# Patient Record
Sex: Male | Born: 1955
Health system: Southern US, Community
[De-identification: ages and names within clinical notes are randomized; demographics above are authoritative.]

## PROBLEM LIST (undated history)

## (undated) DIAGNOSIS — K8020 Calculus of gallbladder without cholecystitis without obstruction: Secondary | ICD-10-CM

## (undated) DIAGNOSIS — I10 Essential (primary) hypertension: Secondary | ICD-10-CM

## (undated) DIAGNOSIS — E785 Hyperlipidemia, unspecified: Secondary | ICD-10-CM

## (undated) HISTORY — DX: Essential (primary) hypertension: I10

## (undated) HISTORY — DX: Calculus of gallbladder without cholecystitis without obstruction: K80.20

## (undated) HISTORY — DX: Hyperlipidemia, unspecified: E78.5

---

## 2002-10-02 ENCOUNTER — Emergency Department (HOSPITAL_COMMUNITY): Admission: EM | Admit: 2002-10-02 | Discharge: 2002-10-02 | Payer: Self-pay | Admitting: Emergency Medicine

## 2006-11-08 ENCOUNTER — Ambulatory Visit: Payer: Self-pay | Admitting: Unknown Physician Specialty

## 2006-12-08 ENCOUNTER — Ambulatory Visit: Payer: Self-pay | Admitting: Unknown Physician Specialty

## 2007-01-08 ENCOUNTER — Ambulatory Visit: Payer: Self-pay | Admitting: Unknown Physician Specialty

## 2010-07-08 HISTORY — PX: COLONOSCOPY: SHX174

## 2011-06-21 ENCOUNTER — Encounter (HOSPITAL_COMMUNITY): Payer: Self-pay | Admitting: Emergency Medicine

## 2011-06-21 ENCOUNTER — Emergency Department (HOSPITAL_COMMUNITY)
Admission: EM | Admit: 2011-06-21 | Discharge: 2011-06-21 | Disposition: A | Discharge status: Home / Self Care | Payer: 59 | Attending: Emergency Medicine | Admitting: Emergency Medicine

## 2011-06-21 ENCOUNTER — Emergency Department (HOSPITAL_COMMUNITY): Payer: 59

## 2011-06-21 DIAGNOSIS — R1013 Epigastric pain: Secondary | ICD-10-CM | POA: Insufficient documentation

## 2011-06-21 DIAGNOSIS — I498 Other specified cardiac arrhythmias: Secondary | ICD-10-CM | POA: Insufficient documentation

## 2011-06-21 DIAGNOSIS — K8050 Calculus of bile duct without cholangitis or cholecystitis without obstruction: Secondary | ICD-10-CM

## 2011-06-21 DIAGNOSIS — R1011 Right upper quadrant pain: Secondary | ICD-10-CM | POA: Insufficient documentation

## 2011-06-21 DIAGNOSIS — I4510 Unspecified right bundle-branch block: Secondary | ICD-10-CM | POA: Insufficient documentation

## 2011-06-21 DIAGNOSIS — K8020 Calculus of gallbladder without cholecystitis without obstruction: Secondary | ICD-10-CM | POA: Insufficient documentation

## 2011-06-21 LAB — DIFFERENTIAL
Basophils Absolute: 0 10*3/uL (ref 0.0–0.1)
Basophils Relative: 0 % (ref 0–1)
Eosinophils Absolute: 0 10*3/uL (ref 0.0–0.7)
Eosinophils Relative: 0 % (ref 0–5)
Lymphocytes Relative: 10 % — ABNORMAL LOW (ref 12–46)
Lymphs Abs: 1.2 10*3/uL (ref 0.7–4.0)
Monocytes Absolute: 0.7 10*3/uL (ref 0.1–1.0)
Monocytes Relative: 6 % (ref 3–12)
Neutro Abs: 10.3 10*3/uL — ABNORMAL HIGH (ref 1.7–7.7)
Neutrophils Relative %: 84 % — ABNORMAL HIGH (ref 43–77)

## 2011-06-21 LAB — URINALYSIS, ROUTINE W REFLEX MICROSCOPIC
Bilirubin Urine: NEGATIVE
Glucose, UA: NEGATIVE mg/dL
Hgb urine dipstick: NEGATIVE
Ketones, ur: NEGATIVE mg/dL
Leukocytes, UA: NEGATIVE
Nitrite: NEGATIVE
Protein, ur: NEGATIVE mg/dL
Specific Gravity, Urine: 1.022 (ref 1.005–1.030)
Urobilinogen, UA: 0.2 mg/dL (ref 0.0–1.0)
pH: 8.5 — ABNORMAL HIGH (ref 5.0–8.0)

## 2011-06-21 LAB — COMPREHENSIVE METABOLIC PANEL
ALT: 14 U/L (ref 0–53)
AST: 16 U/L (ref 0–37)
Albumin: 4.1 g/dL (ref 3.5–5.2)
Alkaline Phosphatase: 51 U/L (ref 39–117)
BUN: 12 mg/dL (ref 6–23)
CO2: 25 mEq/L (ref 19–32)
Calcium: 9.4 mg/dL (ref 8.4–10.5)
Chloride: 106 mEq/L (ref 96–112)
Creatinine, Ser: 1.27 mg/dL (ref 0.50–1.35)
GFR calc Af Amer: 72 mL/min — ABNORMAL LOW (ref >90)
GFR calc non Af Amer: 62 mL/min — ABNORMAL LOW (ref >90)
Glucose, Bld: 138 mg/dL — ABNORMAL HIGH (ref 70–99)
Potassium: 3.4 mEq/L — ABNORMAL LOW (ref 3.5–5.1)
Sodium: 140 mEq/L (ref 135–145)
Total Bilirubin: 0.8 mg/dL (ref 0.3–1.2)
Total Protein: 7.5 g/dL (ref 6.0–8.3)

## 2011-06-21 LAB — CBC
HCT: 41.8 % (ref 39.0–52.0)
Hemoglobin: 14.5 g/dL (ref 13.0–17.0)
MCH: 28.4 pg (ref 26.0–34.0)
MCHC: 34.7 g/dL (ref 30.0–36.0)
MCV: 82 fL (ref 78.0–100.0)
Platelets: 203 10*3/uL (ref 150–400)
RBC: 5.1 MIL/uL (ref 4.22–5.81)
RDW: 13.1 % (ref 11.5–15.5)
WBC: 12.2 10*3/uL — ABNORMAL HIGH (ref 4.0–10.5)

## 2011-06-21 LAB — LIPASE, BLOOD: Lipase: 21 U/L (ref 11–59)

## 2011-06-21 LAB — POCT I-STAT TROPONIN I: Troponin i, poc: 0 ng/mL (ref 0.00–0.08)

## 2011-06-21 MED ORDER — MORPHINE SULFATE 4 MG/ML IJ SOLN
INTRAMUSCULAR | Status: AC
  Administered 2011-06-21: 4 mg via INTRAVENOUS
  Filled 2011-06-21: qty 1

## 2011-06-21 MED ORDER — MORPHINE SULFATE 4 MG/ML IJ SOLN
4.0000 mg | Freq: Once | INTRAMUSCULAR | Status: AC
  Administered 2011-06-21: 4 mg via INTRAVENOUS
  Filled 2011-06-21: qty 1

## 2011-06-21 MED ORDER — SODIUM CHLORIDE 0.9 % IV SOLN
1000.0000 mL | INTRAVENOUS | Status: DC
  Administered 2011-06-21 (×4): 1000 mL via INTRAVENOUS

## 2011-06-21 MED ORDER — ONDANSETRON HCL 4 MG/2ML IJ SOLN
INTRAMUSCULAR | Status: AC
  Administered 2011-06-21: 4 mg via INTRAVENOUS
  Filled 2011-06-21: qty 2

## 2011-06-21 MED ORDER — GI COCKTAIL ~~LOC~~
30.0000 mL | Freq: Once | ORAL | Status: AC
  Administered 2011-06-21: 30 mL via ORAL
  Filled 2011-06-21: qty 30

## 2011-06-21 MED ORDER — MORPHINE SULFATE 4 MG/ML IJ SOLN
4.0000 mg | Freq: Once | INTRAMUSCULAR | Status: AC
  Administered 2011-06-21: 4 mg via INTRAVENOUS

## 2011-06-21 MED ORDER — ONDANSETRON HCL 4 MG/2ML IJ SOLN
4.0000 mg | Freq: Once | INTRAMUSCULAR | Status: AC
  Administered 2011-06-21: 4 mg via INTRAVENOUS

## 2011-06-21 MED ORDER — OXYCODONE-ACETAMINOPHEN 5-325 MG PO TABS: 2.0000 | ORAL_TABLET | Freq: Four times a day (QID) | ORAL | Status: AC | PRN

## 2011-06-21 MED ORDER — ONDANSETRON 8 MG PO TBDP: 8.0000 mg | ORAL_TABLET | Freq: Three times a day (TID) | ORAL | Status: AC | PRN

## 2011-06-21 MED ORDER — ONDANSETRON HCL 4 MG/2ML IJ SOLN
4.0000 mg | Freq: Once | INTRAMUSCULAR | Status: AC
  Administered 2011-06-21: 4 mg via INTRAVENOUS
  Filled 2011-06-21: qty 2

## 2011-06-21 NOTE — ED Notes (Signed)
Per EMS,pt. Is from home with complaint of acute severe abdominal pain ,denies N/V, no chest pain. Pt.is alert and oriented.

## 2011-06-21 NOTE — Discharge Instructions (Signed)
Biliary Colic  Biliary colic is a steady or irregular pain in the upper abdomen. It is usually under the right side of the rib cage. It happens when gallstones interfere with the normal flow of bile from the gallbladder. Bile is a liquid that helps to digest fats. Bile is made in the liver and stored in the gallbladder. When you eat a meal, bile passes from the gallbladder through the cystic duct and the common bile duct into the small intestine. There, it mixes with partially digested food. If a gallstone blocks either of these ducts, the normal flow of bile is blocked. The muscle cells in the bile duct contract forcefully to try to move the stone. This causes the pain of biliary colic.  SYMPTOMS   A person with biliary colic usually complains of pain in the upper abdomen. This pain can be:   In the center of the upper abdomen just below the breastbone.   In the upper-right part of the abdomen, near the gallbladder and liver.   Spread back toward the right shoulder blade.   Nausea and vomiting.   The pain usually occurs after eating.   Biliary colic is usually triggered by the digestive system's demand for bile. The demand for bile is high after fatty meals. Symptoms can also occur when a person who has been fasting suddenly eats a very large meal. Most episodes of biliary colic pass after 1 to 5 hours. After the most intense pain passes, your abdomen may continue to ache mildly for about 24 hours.  DIAGNOSIS  After you describe your symptoms, your caregiver will perform a physical exam. He or she will pay attention to the upper right portion of your belly (abdomen). This is the area of your liver and gallbladder. An ultrasound will help your caregiver look for gallstones. Specialized scans of the gallbladder may also be done. Blood tests may be done, especially if you have fever or if your pain persists. PREVENTION  Biliary colic can be prevented by controlling the risk factors for gallstones.  Some of these risk factors, such as heredity, increasing age, and pregnancy are a normal part of life. Obesity and a high-fat diet are risk factors you can change through a healthy lifestyle. Women going through menopause who take hormone replacement therapy (estrogen) are also more likely to develop biliary colic. TREATMENT   Pain medication may be prescribed.   You may be encouraged to eat a fat-free diet.   If the first episode of biliary colic is severe, or episodes of colic keep retuning, surgery to remove the gallbladder (cholecystectomy) is usually recommended. This procedure can be done through small incisions using an instrument called a laparoscope. The procedure often requires a brief stay in the hospital. Some people can leave the hospital the same day. It is the most widely used treatment in people troubled by painful gallstones. It is effective and safe, with no complications in more than 90% of cases.   If surgery cannot be done, medication that dissolves gallstones may be used. This medication is expensive and can take months or years to work. Only small stones will dissolve.   Rarely, medication to dissolve gallstones is combined with a procedure called shock-wave lithotripsy. This procedure uses carefully aimed shock waves to break up gallstones. In many people treated with this procedure, gallstones form again within a few years.  PROGNOSIS  If gallstones block your cystic duct or common bile duct, you are at risk for repeated episodes of   duct, you are at risk for repeated episodes of biliary colic. There is also a 25% chance that you will develop a gallbladder infection(acute cholecystitis), or some other complication of gallstones within 10 to 20 years. If you have surgery, schedule it at a time that is convenient for you and at a time when you are not sick.  HOME CARE INSTRUCTIONS    Drink plenty of clear fluids.   Avoid fatty, greasy or fried foods, or any foods that make your pain worse.   Take medications as directed.  SEEK MEDICAL  CARE IF:    You develop a fever over 100.5 F (38.1 C).   Your pain gets worse over time.   You develop nausea that prevents you from eating and drinking.   You develop vomiting.  SEEK IMMEDIATE MEDICAL CARE IF:    You have continuous or severe belly (abdominal) pain which is not relieved with medications.   You develop nausea and vomiting which is not relieved with medications.   You have symptoms of biliary colic and you suddenly develop a fever and shaking chills. This may signal cholecystitis. Call your caregiver immediately.   You develop a yellow color to your skin or the white part of your eyes (jaundice).  Document Released: 07/27/2005 Document Revised: 02/12/2011 Document Reviewed: 10/06/2007  [employer] Patient Information 2012 [employer].

## 2011-06-21 NOTE — ED Provider Notes (Signed)
History     CSN: [id number]  Arrival date & time 06/21/11  0455   First MD Initiated Contact with Patient 06/21/11 [phone]      Chief Complaint  Patient presents with  . Abdominal Pain    (Consider location/radiation/quality/duration/timing/severity/associated sxs/prior treatment) HPI Patient is a 56 year old male who presents for evaluation of epigastric abdominal pain that began acutely at around midnight. The patient had had pizza prior to bedtime. He denies a burning sensation and reports that his pain is "just pain". He rated this initially is a 10 out of 10 but says now it as a 6/10 after receiving morphine and Zofran and was given by the overnight physician. Patient is hemodynamically stable. He denies any chest pain or shortness of breath and has no history of diabetes or coronary artery disease. His wife does report that he got very sweaty with the most intense portion of episode. He denies any diarrhea or constipation. The patient has had no blood in his stools or dark her stools. Patient has no known sick contacts. Nothing has made his symptoms better or worse. His pain is without radiation. History reviewed. No pertinent past medical history.  History reviewed. No pertinent past surgical history.  History reviewed. No pertinent family history.  History  Substance Use Topics  . Smoking status: Former Smoker -- 5 years    Types: Cigarettes  . Smokeless tobacco: Not on file  . Alcohol Use:      occasional      Review of Systems  Constitutional: Negative.   HENT: Negative.   Eyes: Negative.   Respiratory: Negative.   Cardiovascular: Negative.   Gastrointestinal: Positive for nausea and abdominal pain.  Genitourinary: Negative.   Musculoskeletal: Negative.   Neurological: Negative.   Hematological: Negative.   Psychiatric/Behavioral: Negative.   All other systems reviewed and are negative.    Allergies  Review of patient's allergies indicates no known  allergies.  Home Medications  No current outpatient prescriptions on file.  BP 119/50  Pulse 48  Temp(Src) 97.9 F (36.6 C) (Oral)  Resp 16  Ht 5\' 10"  (1.778 m)  Wt 300 lb (136.079 kg)  BMI 43.05 kg/m2  SpO2 100%  Physical Exam  Nursing note and vitals reviewed. GEN: Well-developed, well-nourished male in no distress HEENT: Atraumatic, normocephalic. Oropharynx clear without erythema EYES: PERRLA BL, no scleral icterus. NECK: Trachea midline, no meningismus CV: regular rate and rhythm. No murmurs, rubs, or gallops PULM: No respiratory distress.  No crackles, wheezes, or rales. GI: soft, mild tenderness to palpation in the epigastrium and right upper quadrant.. No guarding, rebound. + bowel sounds  GU: deferred Neuro: cranial nerves 2-12 intact, no abnormalities of strength or sensation, A and O x 3 MSK: Patient moves all 4 extremities symmetrically, no deformity, edema, or injury noted Skin: No rashes petechiae, purpura, or jaundice Psych: no abnormality of mood   ED Course  Procedures (including critical care time)   Date: 06/21/2011  Rate: 57  Rhythm: sinus arrhythmia  QRS Axis: normal  Intervals: normal  ST/T Wave abnormalities: nonspecific T wave changes  Conduction Disutrbances:incomplete RBBB  Narrative Interpretation:   Old EKG Reviewed: none available   Labs Reviewed  CBC - Abnormal; Notable for the following:    WBC 12.2 (*)    All other components within normal limits  DIFFERENTIAL - Abnormal; Notable for the following:    Neutrophils Relative 84 (*)    Neutro Abs 10.3 (*)    Lymphocytes Relative 10 (*)  All other components within normal limits  URINALYSIS, ROUTINE W REFLEX MICROSCOPIC - Abnormal; Notable for the following:    pH 8.5 (*)    All other components within normal limits  SPECIMEN HOLD  COMPREHENSIVE METABOLIC PANEL  LIPASE, BLOOD   No results found.   1. Biliary colic       MDM  Patient had an episode of what appeared to  be GERD vs. Biliary colic that occurred following eating pizza and going to bed.  Labs were ordered ad patient was treated symptomatically.  GI cocktail did not improve his symptoms and he continued to have some pain.  [place] was performed with gall stones but no acute cholecystitis.  Patient was referred to [facility] for evaluation of elective cholecystectomy and discharged with prescriptions for pain and nausea medications.  He was told that his symptoms worsen, he develops vomiting, or if he develops fevers to return for further evaluation.  Patient and family were comfortable with plan and the patient was discharged in good condition.        Cyndra Numbers, MD 06/22/11 [phone]

## 2011-07-14 ENCOUNTER — Ambulatory Visit (INDEPENDENT_AMBULATORY_CARE_PROVIDER_SITE_OTHER): Payer: BC Managed Care – HMO | Admitting: General Surgery

## 2011-07-14 ENCOUNTER — Encounter (INDEPENDENT_AMBULATORY_CARE_PROVIDER_SITE_OTHER): Payer: Self-pay | Admitting: General Surgery

## 2011-07-14 DIAGNOSIS — K8020 Calculus of gallbladder without cholecystitis without obstruction: Secondary | ICD-10-CM | POA: Insufficient documentation

## 2011-07-14 NOTE — Progress Notes (Signed)
Subjective:     Patient ID: Barry Clark, male   DOB: [birth year], 56 y.o.   MRN: [id number]  HPI We're asked to see the patient in consultation by Dr. Alto Denver to evaluate him for gallstones. The patient is a 56 year old black male who first had an episode of abdominal pain about a year ago. He then had a severe attack of abdominal pain at Christmas time. This pain lasted about 12 hours. The pain was associated with significant nausea. The pain resolved and then about a month ago he had another severe pain that took him to the emergency room. He is feeling better now. An ultrasound was done at the time but did show a large stone in the gallbladder but no gallbladder wall thickening or ductal dilatation. His liver functions are normal. His lipase was normal.  Review of Systems  Constitutional: Negative.   HENT: Negative.   Eyes: Negative.   Respiratory: Negative.   Cardiovascular: Negative.   Gastrointestinal: Negative.   Genitourinary: Negative.   Musculoskeletal: Negative.   Skin: Negative.   Neurological: Negative.   Hematological: Negative.   Psychiatric/Behavioral: Negative.        Objective:   Physical Exam  Constitutional: He is oriented to person, place, and time. He appears well-developed and well-nourished.  HENT:  Head: Normocephalic and atraumatic.  Eyes: Conjunctivae and EOM are normal. Pupils are equal, round, and reactive to light.  Neck: Normal range of motion. Neck supple.  Cardiovascular: Normal rate, regular rhythm and normal heart sounds.   Pulmonary/Chest: Effort normal and breath sounds normal.  Abdominal: Soft. Bowel sounds are normal. He exhibits no mass. There is no tenderness.  Musculoskeletal: Normal range of motion.  Neurological: He is alert and oriented to person, place, and time.  Skin: Skin is warm and dry.  Psychiatric: He has a normal mood and affect. His behavior is normal.       Assessment:     The patient has what sounds like symptomatic  gallstones. Because of the risk of worsening pain and possible pancreatitis I think he would benefit from having his gallbladder removed. He would also like to have this done. I have discussed with him in detail the risks and benefits of the operation remove the gallbladder as well as some of the technical aspects and he understands and wishes to proceed.    Plan:     Plan for laparoscopic cholecystectomy with intraoperative cholangiogram

## 2011-07-24 ENCOUNTER — Encounter (HOSPITAL_COMMUNITY)
Admission: RE | Admit: 2011-07-24 | Discharge: 2011-07-24 | Disposition: A | Discharge status: Home / Self Care | Payer: BC Managed Care – PPO | Source: Ambulatory Visit | Attending: General Surgery | Admitting: General Surgery

## 2011-07-24 ENCOUNTER — Encounter (HOSPITAL_COMMUNITY): Payer: Self-pay

## 2011-07-24 ENCOUNTER — Telehealth (INDEPENDENT_AMBULATORY_CARE_PROVIDER_SITE_OTHER): Payer: Self-pay | Admitting: General Surgery

## 2011-07-24 LAB — CBC
HCT: 43.1 % (ref 39.0–52.0)
Hemoglobin: 15.2 g/dL (ref 13.0–17.0)
MCH: 28.4 pg (ref 26.0–34.0)
MCHC: 35.3 g/dL (ref 30.0–36.0)
MCV: 80.6 fL (ref 78.0–100.0)
Platelets: 178 10*3/uL (ref 150–400)
RBC: 5.35 MIL/uL (ref 4.22–5.81)
RDW: 13.4 % (ref 11.5–15.5)
WBC: 9 10*3/uL (ref 4.0–10.5)

## 2011-07-24 LAB — SURGICAL PCR SCREEN
MRSA, PCR: NEGATIVE
Staphylococcus aureus: NEGATIVE

## 2011-07-24 MED ORDER — CHLORHEXIDINE GLUCONATE 4 % EX LIQD: 1.0000 "application " | Freq: Once | CUTANEOUS | Status: DC

## 2011-07-24 NOTE — Pre-Procedure Instructions (Addendum)
20 Siddarth Hsiung  07/24/2011   Your procedure is scheduled on 08/06/11  Report to [facility] at 630 AM.  Call this number if you have problems the morning of surgery: [phone]   Remember:   Do not eat food:After Midnight.  May have clear liquids: up to 4 Hours before arrival. 230 am  Clear liquids include soda, tea, black coffee, apple or grape juice, broth.  Take these medicines the morning of surgery with A SIP OF WATER: pain med ,zofran STOP any aspirin, nsaids, herbal meds, blood thinners   Do not wear jewelry,   Do not wear lotions, powders, . You may wear deodorant.  Do not shave 48 hours prior to surgery. Men may shave face and neck.  Do not bring valuables to the hospital.  Contacts, dentures or bridgework may not be worn into surgery.  Leave suitcase in the car. After surgery it may be brought to your room.  For patients admitted to the hospital, checkout time is 11:00 AM the day of discharge.   Patients discharged the day of surgery will not be allowed to drive home.  Name and phone number of your driver: Tobi Bastos [phone]  Special Instructions: CHG Shower Use Special Wash: 1/2 bottle night before surgery and 1/2 bottle morning of surgery.   Please read over the following fact sheets that you were given: Pain Booklet, Coughing and Deep Breathing, MRSA Information and Surgical Site Infection Prevention

## 2011-07-24 NOTE — Telephone Encounter (Signed)
You saw this pt back on 07-14-11 in the office for gallbladder

## 2011-07-24 NOTE — Telephone Encounter (Signed)
Message copied by Wilder Glade on Fri Jul 24, 2011 10:08 AM ------      Message from: Caleen Essex III      Created: Fri Jul 24, 2011 10:00 AM       i don't know this pt

## 2011-07-27 ENCOUNTER — Encounter (HOSPITAL_COMMUNITY): Payer: Self-pay | Admitting: Pharmacy Technician

## 2011-08-05 MED ORDER — CEFAZOLIN SODIUM-DEXTROSE 2-3 GM-% IV SOLR
2.0000 g | INTRAVENOUS | Status: DC
  Filled 2011-08-05: qty 50

## 2011-08-06 ENCOUNTER — Encounter (HOSPITAL_COMMUNITY): Payer: Self-pay | Admitting: Anesthesiology

## 2011-08-06 ENCOUNTER — Ambulatory Visit (HOSPITAL_COMMUNITY): Payer: BC Managed Care – PPO

## 2011-08-06 ENCOUNTER — Ambulatory Visit (HOSPITAL_COMMUNITY): Payer: BC Managed Care – PPO | Admitting: Anesthesiology

## 2011-08-06 ENCOUNTER — Encounter (HOSPITAL_COMMUNITY)
Admission: RE | Disposition: A | Discharge status: Home / Self Care | Payer: Self-pay | Source: Ambulatory Visit | Attending: General Surgery

## 2011-08-06 ENCOUNTER — Ambulatory Visit (HOSPITAL_COMMUNITY)
Admission: RE | Admit: 2011-08-06 | Discharge: 2011-08-06 | Disposition: A | Discharge status: Home / Self Care | Payer: BC Managed Care – PPO | Source: Ambulatory Visit | Attending: General Surgery | Admitting: General Surgery

## 2011-08-06 ENCOUNTER — Encounter (HOSPITAL_COMMUNITY): Payer: Self-pay | Admitting: *Deleted

## 2011-08-06 DIAGNOSIS — K8010 Calculus of gallbladder with chronic cholecystitis without obstruction: Secondary | ICD-10-CM

## 2011-08-06 DIAGNOSIS — Z01812 Encounter for preprocedural laboratory examination: Secondary | ICD-10-CM | POA: Insufficient documentation

## 2011-08-06 HISTORY — PX: CHOLECYSTECTOMY: SHX55

## 2011-08-06 SURGERY — LAPAROSCOPIC CHOLECYSTECTOMY WITH INTRAOPERATIVE CHOLANGIOGRAM
Anesthesia: General | Site: Abdomen | Wound class: Clean Contaminated

## 2011-08-06 MED ORDER — HYDROMORPHONE HCL PF 1 MG/ML IJ SOLN: 0.2500 mg | INTRAMUSCULAR | Status: DC | PRN

## 2011-08-06 MED ORDER — OXYCODONE HCL 5 MG PO TABS
5.0000 mg | ORAL_TABLET | ORAL | Status: DC | PRN
  Administered 2011-08-06 (×2): 5 mg via ORAL

## 2011-08-06 MED ORDER — SODIUM CHLORIDE 0.9 % IJ SOLN: 3.0000 mL | Freq: Two times a day (BID) | INTRAMUSCULAR | Status: DC

## 2011-08-06 MED ORDER — MEPERIDINE HCL 25 MG/ML IJ SOLN: 6.2500 mg | INTRAMUSCULAR | Status: DC | PRN

## 2011-08-06 MED ORDER — LIDOCAINE HCL 4 % MT SOLN
OROMUCOSAL | Status: DC | PRN
  Administered 2011-08-06: 4 mL via TOPICAL

## 2011-08-06 MED ORDER — MIDAZOLAM HCL 5 MG/5ML IJ SOLN
INTRAMUSCULAR | Status: DC | PRN
  Administered 2011-08-06: 2 mg via INTRAVENOUS

## 2011-08-06 MED ORDER — NEOSTIGMINE METHYLSULFATE 1 MG/ML IJ SOLN
INTRAMUSCULAR | Status: DC | PRN
  Administered 2011-08-06: 5 mg via INTRAVENOUS

## 2011-08-06 MED ORDER — SODIUM CHLORIDE 0.9 % IV SOLN: 250.0000 mL | INTRAVENOUS | Status: DC | PRN

## 2011-08-06 MED ORDER — LIDOCAINE HCL (CARDIAC) 20 MG/ML IV SOLN
INTRAVENOUS | Status: DC | PRN
  Administered 2011-08-06: 100 mg via INTRAVENOUS

## 2011-08-06 MED ORDER — PROMETHAZINE HCL 25 MG/ML IJ SOLN: 6.2500 mg | INTRAMUSCULAR | Status: DC | PRN

## 2011-08-06 MED ORDER — SODIUM CHLORIDE 0.9 % IJ SOLN: 3.0000 mL | INTRAMUSCULAR | Status: DC | PRN

## 2011-08-06 MED ORDER — FENTANYL CITRATE 0.05 MG/ML IJ SOLN
INTRAMUSCULAR | Status: DC | PRN
  Administered 2011-08-06: 100 ug via INTRAVENOUS
  Administered 2011-08-06 (×2): 50 ug via INTRAVENOUS

## 2011-08-06 MED ORDER — GLYCOPYRROLATE 0.2 MG/ML IJ SOLN
INTRAMUSCULAR | Status: DC | PRN
  Administered 2011-08-06: .8 mg via INTRAVENOUS

## 2011-08-06 MED ORDER — HYDROCODONE-ACETAMINOPHEN 5-325 MG PO TABS: 1.0000 | ORAL_TABLET | Freq: Four times a day (QID) | ORAL | Status: AC | PRN

## 2011-08-06 MED ORDER — SODIUM CHLORIDE 0.9 % IV SOLN
INTRAVENOUS | Status: DC | PRN
  Administered 2011-08-06: 10:00:00

## 2011-08-06 MED ORDER — LACTATED RINGERS IV SOLN: INTRAVENOUS | Status: DC

## 2011-08-06 MED ORDER — ACETAMINOPHEN 650 MG RE SUPP
650.0000 mg | RECTAL | Status: DC | PRN
  Filled 2011-08-06: qty 1

## 2011-08-06 MED ORDER — LACTATED RINGERS IV SOLN
INTRAVENOUS | Status: DC | PRN
  Administered 2011-08-06: 08:00:00 via INTRAVENOUS

## 2011-08-06 MED ORDER — ONDANSETRON HCL 4 MG/2ML IJ SOLN
4.0000 mg | Freq: Four times a day (QID) | INTRAMUSCULAR | Status: DC | PRN
  Filled 2011-08-06: qty 2

## 2011-08-06 MED ORDER — ROCURONIUM BROMIDE 100 MG/10ML IV SOLN
INTRAVENOUS | Status: DC | PRN
  Administered 2011-08-06: 10 mg via INTRAVENOUS
  Administered 2011-08-06: 50 mg via INTRAVENOUS
  Administered 2011-08-06: 10 mg via INTRAVENOUS

## 2011-08-06 MED ORDER — PROPOFOL 10 MG/ML IV EMUL
INTRAVENOUS | Status: DC | PRN
  Administered 2011-08-06: 200 mg via INTRAVENOUS

## 2011-08-06 MED ORDER — SODIUM CHLORIDE 0.9 % IR SOLN
Status: DC | PRN
  Administered 2011-08-06: 1000 mL

## 2011-08-06 MED ORDER — 0.9 % SODIUM CHLORIDE (POUR BTL) OPTIME
TOPICAL | Status: DC | PRN
  Administered 2011-08-06: 1000 mL

## 2011-08-06 MED ORDER — BUPIVACAINE-EPINEPHRINE 0.25% -1:200000 IJ SOLN
INTRAMUSCULAR | Status: DC | PRN
  Administered 2011-08-06: 25 mL

## 2011-08-06 MED ORDER — CEFAZOLIN SODIUM 1-5 GM-% IV SOLN
INTRAVENOUS | Status: DC | PRN
  Administered 2011-08-06: 2 g via INTRAVENOUS

## 2011-08-06 MED ORDER — ACETAMINOPHEN 325 MG PO TABS
650.0000 mg | ORAL_TABLET | ORAL | Status: DC | PRN
  Filled 2011-08-06: qty 2

## 2011-08-06 MED ORDER — OXYCODONE HCL 5 MG PO TABS
ORAL_TABLET | ORAL | Status: AC
  Filled 2011-08-06: qty 1

## 2011-08-06 MED ORDER — ONDANSETRON HCL 4 MG/2ML IJ SOLN
INTRAMUSCULAR | Status: DC | PRN
  Administered 2011-08-06: 4 mg via INTRAVENOUS

## 2011-08-06 SURGICAL SUPPLY — 43 items
ADH SKN CLS APL DERMABOND .7 (GAUZE/BANDAGES/DRESSINGS) ×1
APPLIER CLIP ROT 10 11.4 M/L (STAPLE) ×2
APR CLP MED LRG 11.4X10 (STAPLE) ×1
BAG SPEC RTRVL LRG 6X4 10 (ENDOMECHANICALS) ×1
BLADE SURG ROTATE 9660 (MISCELLANEOUS) ×1 IMPLANT
CANISTER SUCTION 2500CC (MISCELLANEOUS) ×2 IMPLANT
CATH REDDICK CHOLANGI 4FR 50CM (CATHETERS) ×2 IMPLANT
CHLORAPREP W/TINT 26ML (MISCELLANEOUS) ×2 IMPLANT
CLIP APPLIE ROT 10 11.4 M/L (STAPLE) ×1 IMPLANT
CLOTH BEACON ORANGE TIMEOUT ST (SAFETY) ×2 IMPLANT
COVER MAYO STAND STRL (DRAPES) ×2 IMPLANT
COVER SURGICAL LIGHT HANDLE (MISCELLANEOUS) ×2 IMPLANT
DECANTER SPIKE VIAL GLASS SM (MISCELLANEOUS) ×2 IMPLANT
DERMABOND ADVANCED (GAUZE/BANDAGES/DRESSINGS) ×1
DERMABOND ADVANCED .7 DNX12 (GAUZE/BANDAGES/DRESSINGS) ×1 IMPLANT
DRAPE C-ARM 42X72 X-RAY (DRAPES) ×2 IMPLANT
DRAPE UTILITY 15X26 W/TAPE STR (DRAPE) ×4 IMPLANT
ELECT REM PT RETURN 9FT ADLT (ELECTROSURGICAL) ×2
ELECTRODE REM PT RTRN 9FT ADLT (ELECTROSURGICAL) ×1 IMPLANT
GLOVE BIO SURGEON STRL SZ7.5 (GLOVE) ×4 IMPLANT
GLOVE BIOGEL PI IND STRL 7.0 (GLOVE) IMPLANT
GLOVE BIOGEL PI IND STRL 7.5 (GLOVE) IMPLANT
GLOVE BIOGEL PI INDICATOR 7.0 (GLOVE) ×1
GLOVE BIOGEL PI INDICATOR 7.5 (GLOVE) ×1
GLOVE SURG SS PI 7.0 STRL IVOR (GLOVE) ×1 IMPLANT
GOWN STRL NON-REIN LRG LVL3 (GOWN DISPOSABLE) ×4 IMPLANT
IV CATH 14GX2 1/4 (CATHETERS) ×2 IMPLANT
KIT BASIN OR (CUSTOM PROCEDURE TRAY) ×2 IMPLANT
KIT ROOM TURNOVER OR (KITS) ×2 IMPLANT
NS IRRIG 1000ML POUR BTL (IV SOLUTION) ×2 IMPLANT
PAD ARMBOARD 7.5X6 YLW CONV (MISCELLANEOUS) ×2 IMPLANT
POUCH SPECIMEN RETRIEVAL 10MM (ENDOMECHANICALS) ×2 IMPLANT
SCISSORS LAP 5X35 DISP (ENDOMECHANICALS) IMPLANT
SET IRRIG TUBING LAPAROSCOPIC (IRRIGATION / IRRIGATOR) ×2 IMPLANT
SLEEVE ENDOPATH XCEL 5M (ENDOMECHANICALS) ×3 IMPLANT
SPECIMEN JAR SMALL (MISCELLANEOUS) ×2 IMPLANT
SUT MNCRL AB 4-0 PS2 18 (SUTURE) ×2 IMPLANT
TOWEL OR 17X24 6PK STRL BLUE (TOWEL DISPOSABLE) ×2 IMPLANT
TOWEL OR 17X26 10 PK STRL BLUE (TOWEL DISPOSABLE) ×2 IMPLANT
TRAY LAPAROSCOPIC (CUSTOM PROCEDURE TRAY) ×2 IMPLANT
TROCAR XCEL BLUNT TIP 100MML (ENDOMECHANICALS) ×2 IMPLANT
TROCAR XCEL NON-BLD 11X100MML (ENDOMECHANICALS) ×1 IMPLANT
TROCAR XCEL NON-BLD 5MMX100MML (ENDOMECHANICALS) ×2 IMPLANT

## 2011-08-06 NOTE — H&P (View-Only) (Signed)
Subjective:     Patient ID: Barry Clark, male   DOB: 02/06/1956, 55 y.o.   MRN: [id number]  HPI We're asked to see the patient in consultation by Dr. Hunt to evaluate him for gallstones. The patient is a 55-year-old black male who first had an episode of abdominal pain about a year ago. He then had a severe attack of abdominal pain at Christmas time. This pain lasted about 12 hours. The pain was associated with significant nausea. The pain resolved and then about a month ago he had another severe pain that took him to the emergency room. He is feeling better now. An ultrasound was done at the time but did show a large stone in the gallbladder but no gallbladder wall thickening or ductal dilatation. His liver functions are normal. His lipase was normal.  Review of Systems  Constitutional: Negative.   HENT: Negative.   Eyes: Negative.   Respiratory: Negative.   Cardiovascular: Negative.   Gastrointestinal: Negative.   Genitourinary: Negative.   Musculoskeletal: Negative.   Skin: Negative.   Neurological: Negative.   Hematological: Negative.   Psychiatric/Behavioral: Negative.        Objective:   Physical Exam  Constitutional: He is oriented to person, place, and time. He appears well-developed and well-nourished.  HENT:  Head: Normocephalic and atraumatic.  Eyes: Conjunctivae and EOM are normal. Pupils are equal, round, and reactive to light.  Neck: Normal range of motion. Neck supple.  Cardiovascular: Normal rate, regular rhythm and normal heart sounds.   Pulmonary/Chest: Effort normal and breath sounds normal.  Abdominal: Soft. Bowel sounds are normal. He exhibits no mass. There is no tenderness.  Musculoskeletal: Normal range of motion.  Neurological: He is alert and oriented to person, place, and time.  Skin: Skin is warm and dry.  Psychiatric: He has a normal mood and affect. His behavior is normal.       Assessment:     The patient has what sounds like symptomatic  gallstones. Because of the risk of worsening pain and possible pancreatitis I think he would benefit from having his gallbladder removed. He would also like to have this done. I have discussed with him in detail the risks and benefits of the operation remove the gallbladder as well as some of the technical aspects and he understands and wishes to proceed.    Plan:     Plan for laparoscopic cholecystectomy with intraoperative cholangiogram      

## 2011-08-06 NOTE — Anesthesia Preprocedure Evaluation (Addendum)
Anesthesia Evaluation  Patient identified by MRN, date of birth, ID band Patient awake    Reviewed: Allergy & Precautions, H&P , NPO status , Patient's Chart, lab work & pertinent test results  Airway Mallampati: II TM Distance: >3 FB Neck ROM: Full    Dental No notable dental hx. (+) Teeth Intact   Pulmonary former smoker breath sounds clear to auscultation  Pulmonary exam normal       Cardiovascular Rhythm:Regular Rate:Normal     Neuro/Psych    GI/Hepatic   Endo/Other  Morbid obesity  Renal/GU      Musculoskeletal   Abdominal   Peds  Hematology   Anesthesia Other Findings   Reproductive/Obstetrics                         Anesthesia Physical Anesthesia Plan  ASA: II  Anesthesia Plan: General   Post-op Pain Management:    Induction: Intravenous  Airway Management Planned: Oral ETT  Additional Equipment:   Intra-op Plan:   Post-operative Plan: Extubation in OR  Informed Consent: I have reviewed the patients History and Physical, chart, labs and discussed the procedure including the risks, benefits and alternatives for the proposed anesthesia with the patient or authorized representative who has indicated his/her understanding and acceptance.   Dental advisory given  Plan Discussed with: CRNA, Anesthesiologist and Surgeon  Anesthesia Plan Comments:         Anesthesia Quick Evaluation

## 2011-08-06 NOTE — Op Note (Signed)
08/06/2011  9:54 AM  PATIENT:  Barry Clark  56 y.o. male  PRE-OPERATIVE DIAGNOSIS:  gallstones  POST-OPERATIVE DIAGNOSIS:  gallstones  PROCEDURE:  Procedure(s) (LRB): LAPAROSCOPIC CHOLECYSTECTOMY WITH INTRAOPERATIVE CHOLANGIOGRAM (N/A)  SURGEON:  Surgeon(s) and Role:    * Robyne Askew, MD - Primary  PHYSICIAN ASSISTANT:   ASSISTANTS: Dr. Gerrit Friends   ANESTHESIA:   general  EBL:  Total I/O In: 1000 [I.V.:1000] Out: -   BLOOD ADMINISTERED:none  DRAINS: none   LOCAL MEDICATIONS USED:  MARCAINE     SPECIMEN:  Source of Specimen:  gallbladder  DISPOSITION OF SPECIMEN:  PATHOLOGY  COUNTS:  YES  TOURNIQUET:  * No tourniquets in log *  DICTATION: .Dragon Dictation  Procedure: After informed consent was obtained the patient was brought to the operating room and placed in the supine position on the operating room table. After adequate induction of general anesthesia the patient's abdomen was prepped with ChloraPrep allowed to dry and draped in usual sterile manner. The area below the umbilicus was infiltrated with quarter percent  Marcaine. A small incision was made with a 15 blade knife. The incision was carried down through the subcutaneous tissue bluntly with a hemostat and [employer] retractors. The linea alba was identified. The linea alba was incised with a 15 blade knife and each side was grasped with Coker clamps. The preperitoneal space was then probed with a hemostat until the peritoneum was opened and access was gained to the abdominal cavity. A 0 Vicryl pursestring stitch was placed in the fascia surrounding the opening. A Hassan cannula was then placed through the opening and anchored in place with the previously placed Vicryl purse string stitch. The abdomen was insufflated with carbon dioxide without difficulty. A laparoscope was inserted through the [facility] cannula in the right upper quadrant was inspected. Next the epigastric region was infiltrated with % Marcaine. A  small incision was made with a 15 blade knife. A 5 mm port was placed bluntly through this incision into the abdominal cavity under direct vision. Next 2 sites were chosen laterally on the right side of the abdomen for placement of 5 mm ports. Each of these areas was infiltrated with quarter percent Marcaine. Small stab incisions were made with a 15 blade knife. 5 mm ports were then placed bluntly through these incisions into the abdominal cavity under direct vision without difficulty. A blunt grasper was placed through the lateralmost 5 mm port and used to grasp the dome of the gallbladder and elevated anteriorly and superiorly. Another blunt grasper was placed through the other 5 mm port and used to retract the body and neck of the gallbladder. A dissector was placed through the epigastric port and using the electrocautery the peritoneal reflection at the gallbladder neck was opened. Blunt dissection was then carried out in this area until the gallbladder neck-cystic duct junction was readily identified and a good window was created. A single clip was placed on the gallbladder neck. A small  ductotomy was made just below the clip with laparoscopic scissors. A 14-gauge Angiocath was then placed through the anterior abdominal wall under direct vision. A Reddick cholangiogram catheter was then placed through the Angiocath and flushed. The catheter was then placed in the cystic duct and anchored in place with a clip. A cholangiogram was obtained that showed no filling defects good emptying into the duodenum an adequate length on the cystic duct. The anchoring clip and catheters were then removed from the patient. 3 clips were placed proximally  on the cystic duct and the duct was divided between the 2 sets of clips. Posterior to this the cystic artery was identified and again dissected bluntly in a circumferential manner until a good window  was created. 2 clips were placed proximally and one distally on the artery and  the artery was divided between the 2 sets of clips. Next a laparoscopic hook cautery device was used to separate the gallbladder from the liver bed. Prior to completely detaching the gallbladder from the liver bed the liver bed was inspected and several small bleeding points were coagulated with the electrocautery until the area was completely hemostatic. The gallbladder was then detached the rest of it from the liver bed without difficulty. A laparoscopic bag was inserted through the hassan port. The gallbladder was placed within the bag and the bag was sealed. A laparoscope was then moved to the epigastric port.  The bag with the gallbladder was then removed with the [facility] cannula through the infraumbilical port without difficulty. The fascial defect was then closed with the previously placed Vicryl pursestring stitch as well as with another figure-of-eight 0 Vicryl stitch. The liver bed was inspected again and found to be hemostatic. The abdomen was irrigated with copious amounts of saline until the effluent was clear. The ports were then removed under direct vision without difficulty and were found to be hemostatic. The gas was allowed to escape. The skin incisions were all closed with interrupted 4-0 Monocryl subcuticular stitches. Dermabond dressings were applied. The patient tolerated the procedure well. At the end of the case all needle sponge and instrument counts were correct. The patient was then awakened and taken to recovery in stable condition   PLAN OF CARE: Discharge to home after PACU  PATIENT DISPOSITION:  PACU - hemodynamically stable.   Delay start of Pharmacological VTE agent (>24hrs) due to surgical blood loss or risk of bleeding: not applicable

## 2011-08-06 NOTE — Transfer of Care (Signed)
Immediate Anesthesia Transfer of Care Note  Patient: Barry Clark  Procedure(s) Performed: Procedure(s) (LRB): LAPAROSCOPIC CHOLECYSTECTOMY WITH INTRAOPERATIVE CHOLANGIOGRAM (N/A)  Patient Location: PACU  Anesthesia Type: General  Level of Consciousness: awake, alert , oriented and patient cooperative  Airway & Oxygen Therapy: Patient Spontanous Breathing and Patient connected to face mask oxygen  Post-op Assessment: Report given to PACU RN  Post vital signs: Reviewed and stable  Complications: No apparent anesthesia complications

## 2011-08-06 NOTE — Anesthesia Postprocedure Evaluation (Signed)
  Anesthesia Post-op Note  Patient: Barry Clark  Procedure(s) Performed: Procedure(s) (LRB): LAPAROSCOPIC CHOLECYSTECTOMY WITH INTRAOPERATIVE CHOLANGIOGRAM (N/A)  Patient Location: PACU  Anesthesia Type: General  Level of Consciousness: awake and alert   Airway and Oxygen Therapy: Patient Spontanous Breathing  Post-op Pain: mild  Post-op Assessment: Post-op Vital signs reviewed, Patient's Cardiovascular Status Stable, Respiratory Function Stable, Patent Airway and No signs of Nausea or vomiting  Post-op Vital Signs: stable  Complications: No apparent anesthesia complications

## 2011-08-06 NOTE — Anesthesia Procedure Notes (Signed)
Procedure Name: Intubation Date/Time: 08/06/2011 8:49 AM Performed by: Glendora Score A Pre-anesthesia Checklist: Patient identified, Emergency Drugs available, Suction available and Patient being monitored Patient Re-evaluated:Patient Re-evaluated prior to inductionOxygen Delivery Method: Circle system utilized Preoxygenation: Pre-oxygenation with 100% oxygen Intubation Type: IV induction Ventilation: Mask ventilation without difficulty and Oral airway inserted - appropriate to patient size Laryngoscope Size: Hyacinth Meeker and 2 Grade View: Grade II Tube type: Oral Tube size: 7.5 mm Number of attempts: 1 Airway Equipment and Method: Stylet and LTA kit utilized Placement Confirmation: ETT inserted through vocal cords under direct vision,  positive ETCO2 and breath sounds checked- equal and bilateral Secured at: 25 cm Tube secured with: Tape Dental Injury: Teeth and Oropharynx as per pre-operative assessment

## 2011-08-06 NOTE — Interval H&P Note (Signed)
History and Physical Interval Note:  08/06/2011 8:32 AM  Barry Clark  has presented today for surgery, with the diagnosis of gallstones  The various methods of treatment have been discussed with the patient and family. After consideration of risks, benefits and other options for treatment, the patient has consented to  Procedure(s) (LRB): LAPAROSCOPIC CHOLECYSTECTOMY WITH INTRAOPERATIVE CHOLANGIOGRAM (N/A) as a surgical intervention .  The patients' history has been reviewed, patient examined, no change in status, stable for surgery.  I have reviewed the patients' chart and labs.  Questions were answered to the patient's satisfaction.     TOTH III,Chester Sibert S

## 2011-08-06 NOTE — Discharge Instructions (Addendum)
May shower. No heavy lifting  Instructions Following General Anesthetic, Adult A nurse specialized in giving anesthesia (anesthetist) or a doctor specialized in giving anesthesia (anesthesiologist) gave you a medicine that made you sleep while a procedure was performed. For as long as 24 hours following this procedure, you may feel:  Dizzy.   Weak.   Drowsy.  AFTER THE PROCEDURE After surgery, you will be taken to the recovery area where a nurse will monitor your progress. You will be allowed to go home when you are awake, stable, taking fluids well, and without complications. For the first 24 hours following an anesthetic:  Have a responsible person with you.   Do not drive a car. If you are alone, do not take public transportation.   Do not drink alcohol.   Do not take medicine that has not been prescribed by your caregiver.   Do not sign important papers or make important decisions.   You may resume normal diet and activities as directed.   Change bandages (dressings) as directed.   Only take over-the-counter or prescription medicines for pain, discomfort, or fever as directed by your caregiver.  If you have questions or problems that seem related to the anesthetic, call the hospital and ask for the anesthetist or anesthesiologist on call. SEEK IMMEDIATE MEDICAL CARE IF:   You develop a rash.   You have difficulty breathing.   You have chest pain.   You develop any allergic problems.  Document Released: 06/01/2000 Document Revised: 02/12/2011 Document Reviewed: 01/10/2007 [facility] Patient Information 2012 [place], [place].

## 2011-08-06 NOTE — Preoperative (Signed)
Beta Blockers   Reason not to administer Beta Blockers:Not Applicable 

## 2011-08-07 ENCOUNTER — Encounter (HOSPITAL_COMMUNITY): Payer: Self-pay | Admitting: General Surgery

## 2011-08-25 ENCOUNTER — Encounter (INDEPENDENT_AMBULATORY_CARE_PROVIDER_SITE_OTHER): Payer: Self-pay | Admitting: General Surgery

## 2011-08-25 ENCOUNTER — Ambulatory Visit (INDEPENDENT_AMBULATORY_CARE_PROVIDER_SITE_OTHER): Payer: BC Managed Care – PPO | Admitting: General Surgery

## 2011-08-25 VITALS — BP 138/90 | HR 70 | Temp 97.6°F | Resp 16 | Ht 70.0 in | Wt 297.1 lb

## 2011-08-25 DIAGNOSIS — K8020 Calculus of gallbladder without cholecystitis without obstruction: Secondary | ICD-10-CM

## 2011-08-25 NOTE — Progress Notes (Signed)
Subjective:     Patient ID: Barry Clark, male   DOB: 1956/02/10, 56 y.o.   MRN: [id number]  HPI The patient is a 56 year old white male who is about 3 weeks out from a laparoscopic cholecystectomy for chronic cholecystitis with stones. He has done well since the surgery. He denies any abdominal pain. He denies any nausea or vomiting. He denies any diarrhea. Overall he feels well.  Review of Systems     Objective:   Physical Exam On exam his abdomen is soft and nontender. His incisions are healing nicely with no sign of infection.    Assessment:     Status post laparoscopic cholecystectomy    Plan:     At this point he seems to be doing very well. I would like him to refrain from strenuous activity for a couple Clark weeks. At that point he can return to his normal activities. Will plan to see him back on a p.r.n. basis.

## 2011-08-25 NOTE — Patient Instructions (Signed)
No strenuous activity for a couple more weeks

## 2013-09-20 IMAGING — US US ABDOMEN COMPLETE
1 series · 14 of 25 positions shown · non-contrast
Comparison: None.

CLINICAL DATA: Right upper quadrant and epigastric pain.

COMPLETE ABDOMINAL ULTRASOUND

[Series 1: us abdomen complete · 0.34mm/px · 14 of 64 slices shown]
[im 1/64]
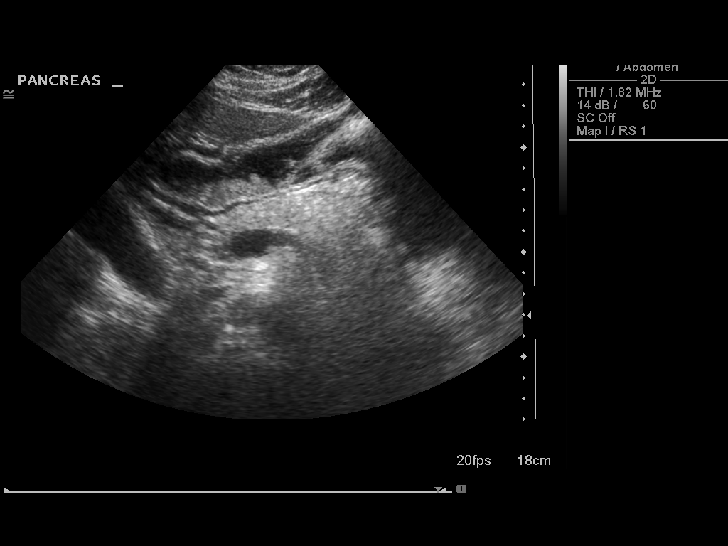
[im 6/64]
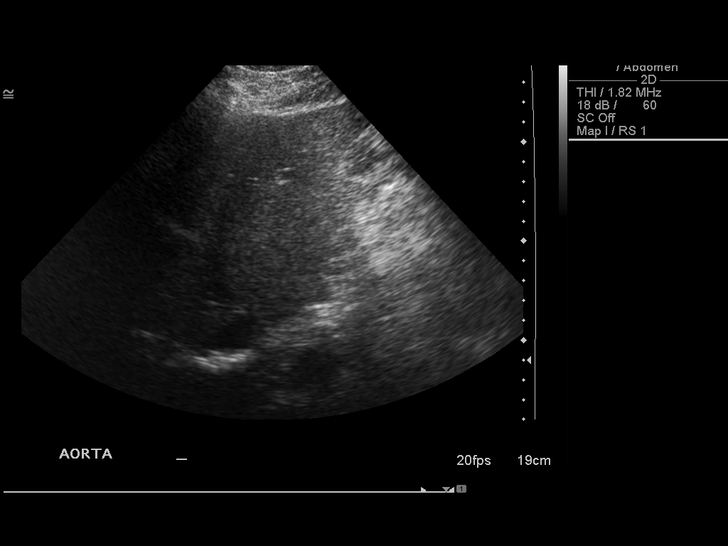
[im 11/64]
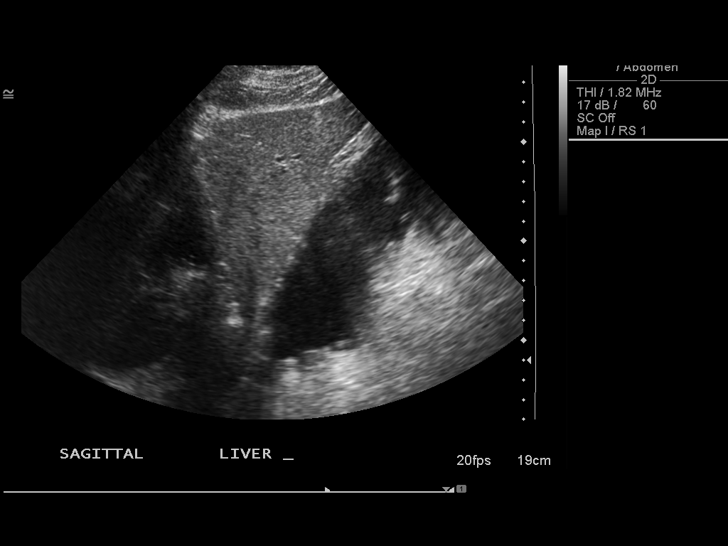
[im 16/64]
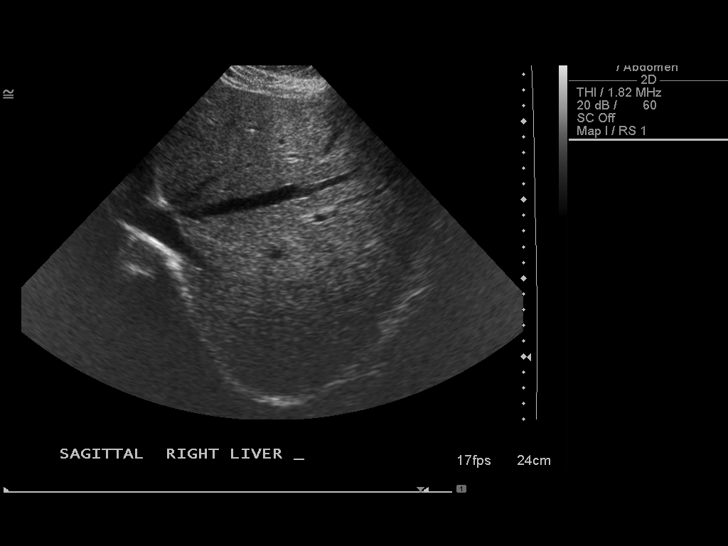
[im 22/64]
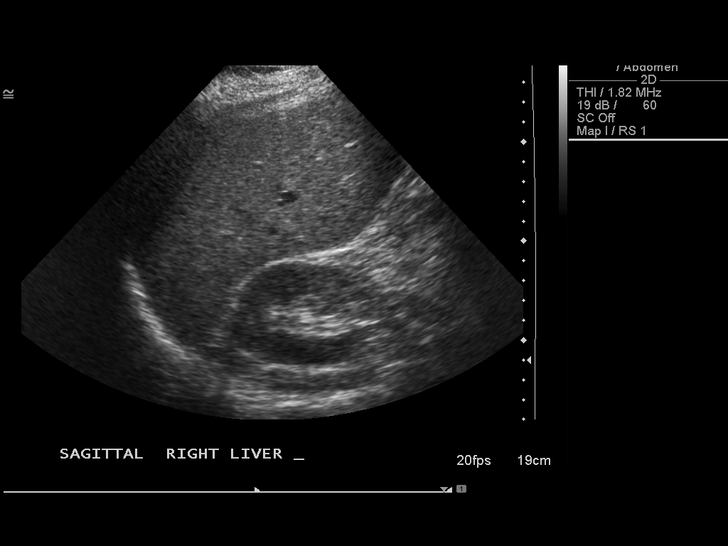
[im 24/64]
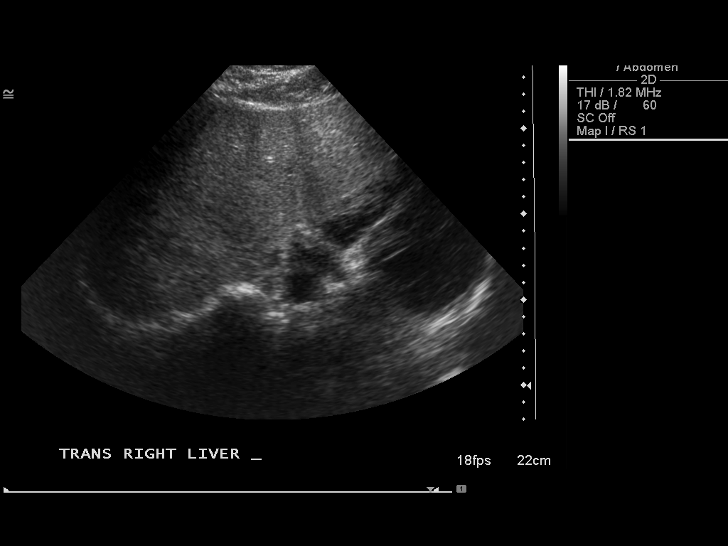
[im 29/64]
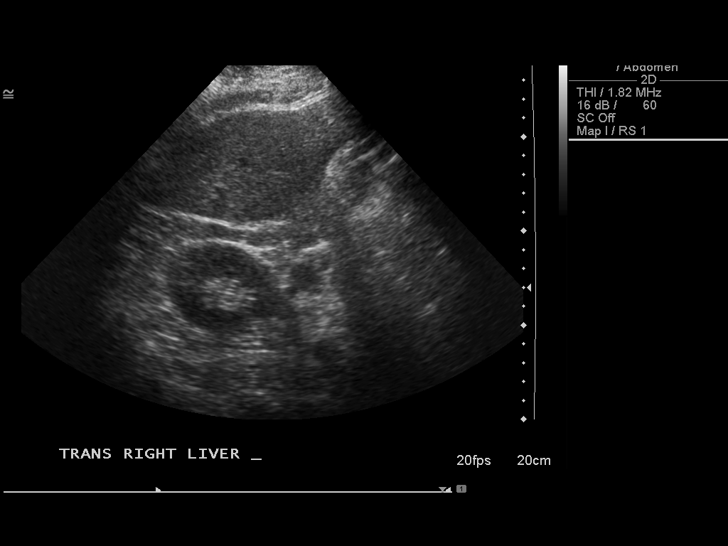
[im 35/64]
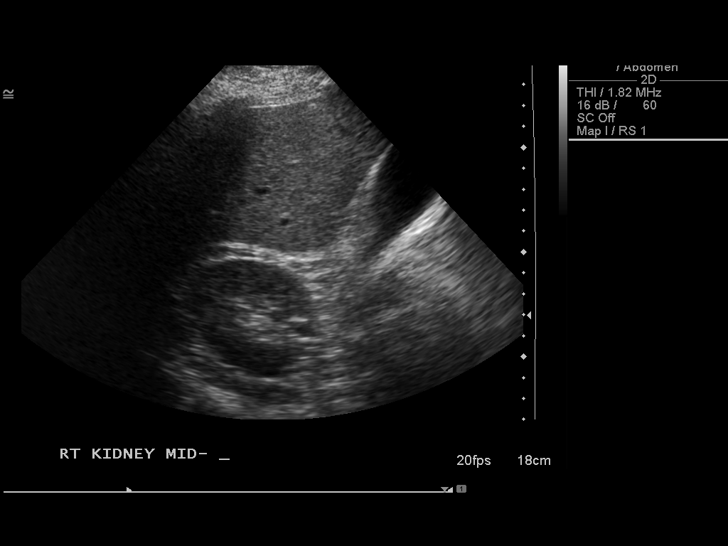
[im 40/64]
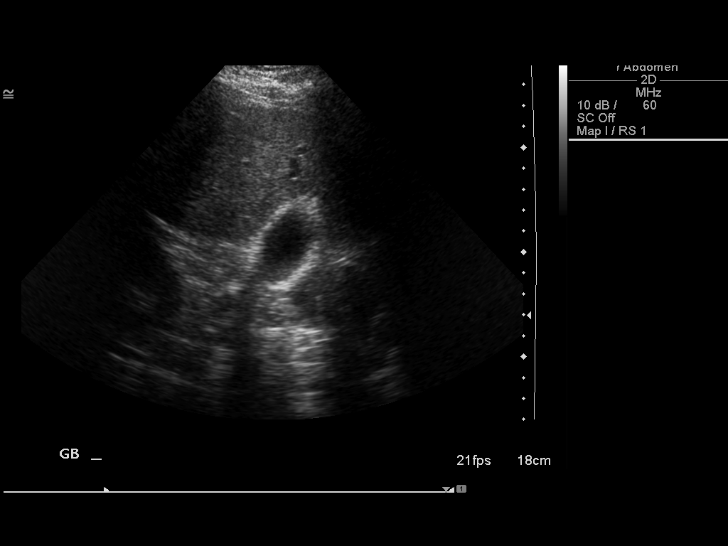
[im 43/64]
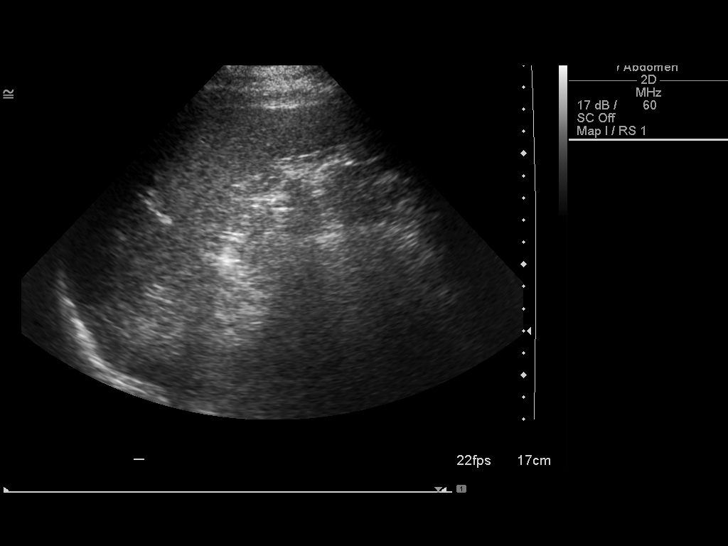
[im 48/64]
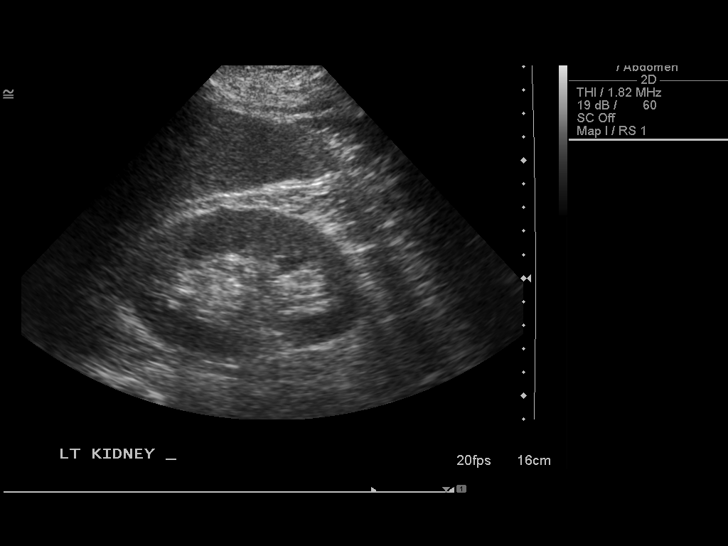
[im 53/64]
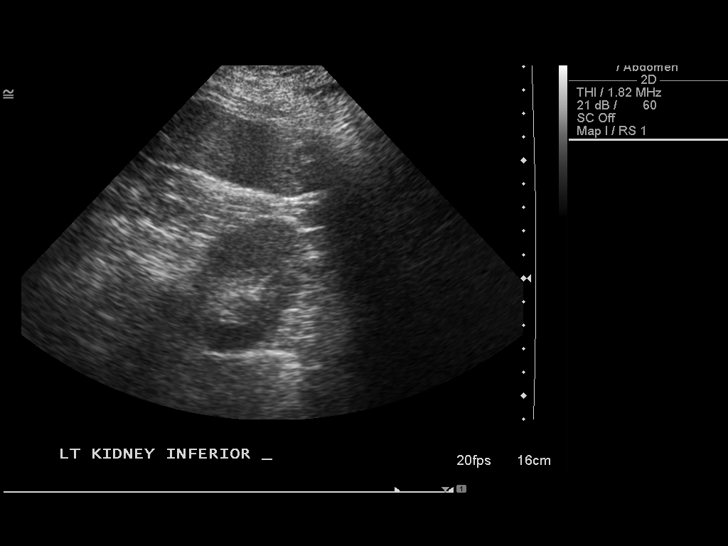
[im 58/64]
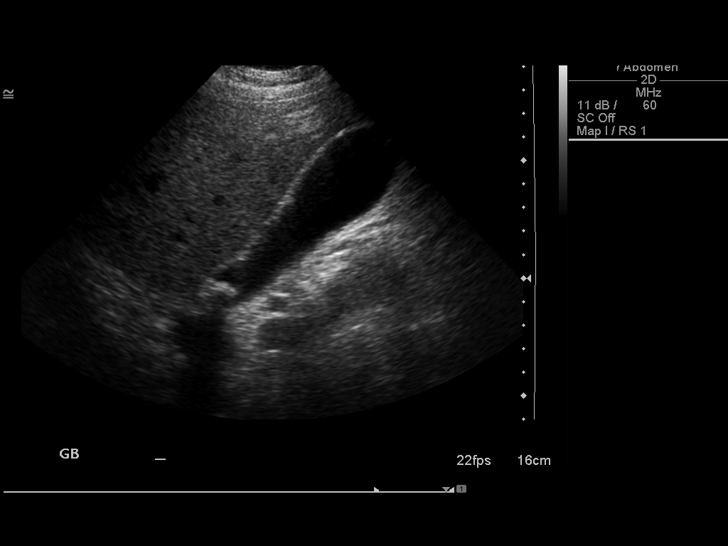
[im 64/64]
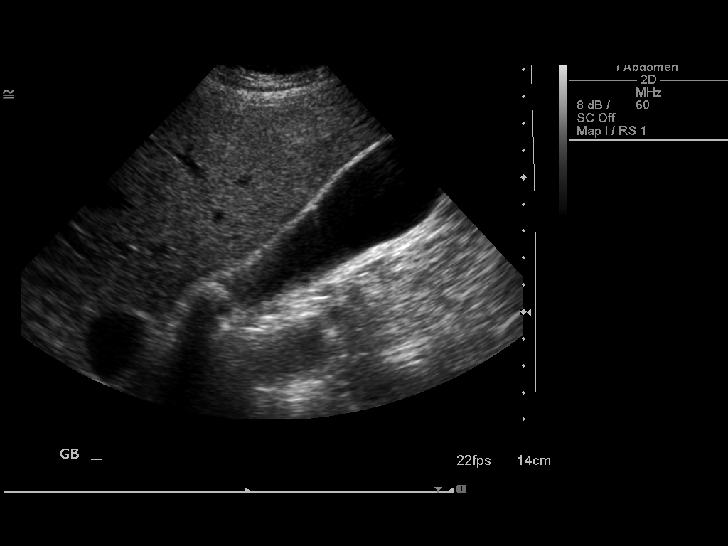

[14 of 25 positions shown; findings below may reference images not displayed]

FINDINGS: Gallbladder:  Echogenic stone at the base of the gallbladder with
posterior acoustic shadowing.  Stone measures up to 1.5 cm. No
significant gallbladder wall thickening, but there are echoes
within the gallbladder which could represent ring-down artifact or
sludge.  No evidence for a sonographic Murphy's sign.

Common bile duct:  Measures 0.5 cm.

Liver:  No focal lesion identified.  Liver parenchyma is slightly
echogenic.

IVC:  Appears normal.

Pancreas:  No focal abnormality seen. Pancreatic tail is obscured
by bowel gas.

Spleen:  Measures 7.6 cm in length.

Right Kidney:  Measures 11.5 cm in length.  Normal renal
echotexture.  Negative for hydronephrosis.

Left Kidney:  Measures 11.4 cm in length without hydronephrosis.

Abdominal aorta:  No aneurysm identified.
IMPRESSION: Cholelithiasis without biliary dilatation. There are some echoes
within the gallbladder which could be related to sludge but cannot
exclude areas of adenomyomatosis.

## 2013-11-05 IMAGING — RF DG CHOLANGIOGRAM OPERATIVE
1 series · 6 of 6 positions shown · non-contrast
Comparison: None

CLINICAL DATA: Cholelithiasis

INTRAOPERATIVE CHOLANGIOGRAM
TECHNIQUE: Cholangiographic images from the C-arm fluoroscopic
device were submitted for interpretation post-operatively.  Please
see the procedural report for the amount of contrast and the
fluoroscopy time utilized.

[Series 1: run · 3 acquisitions, 6 frames shown]
[im 1/3]
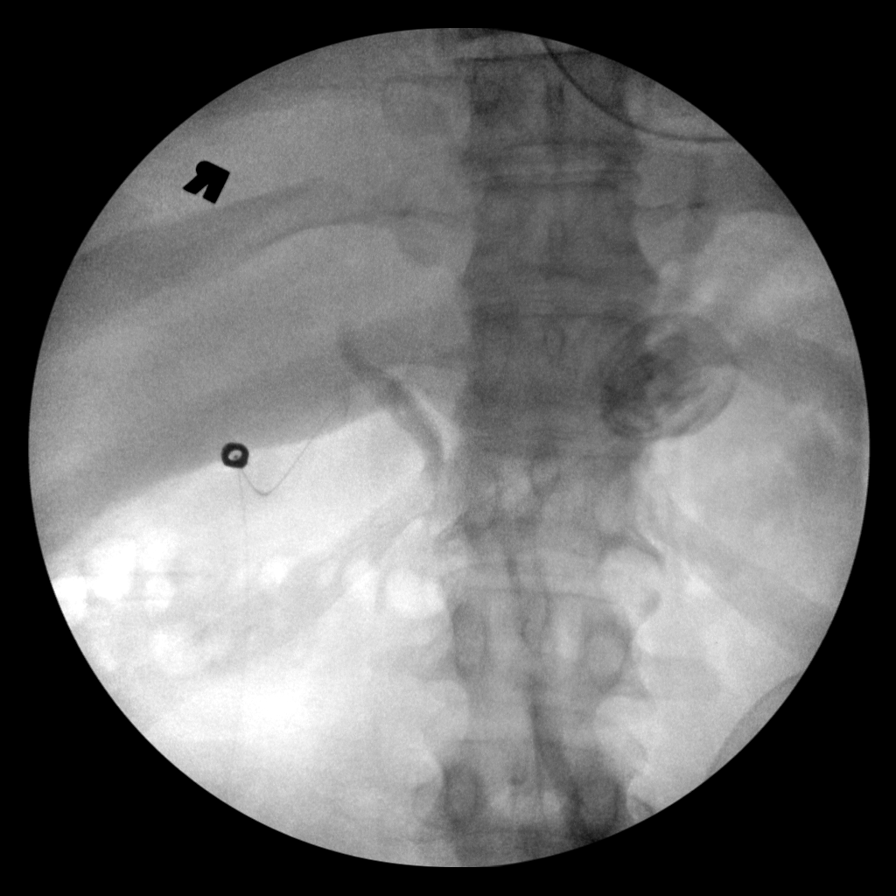
[im 1/3]
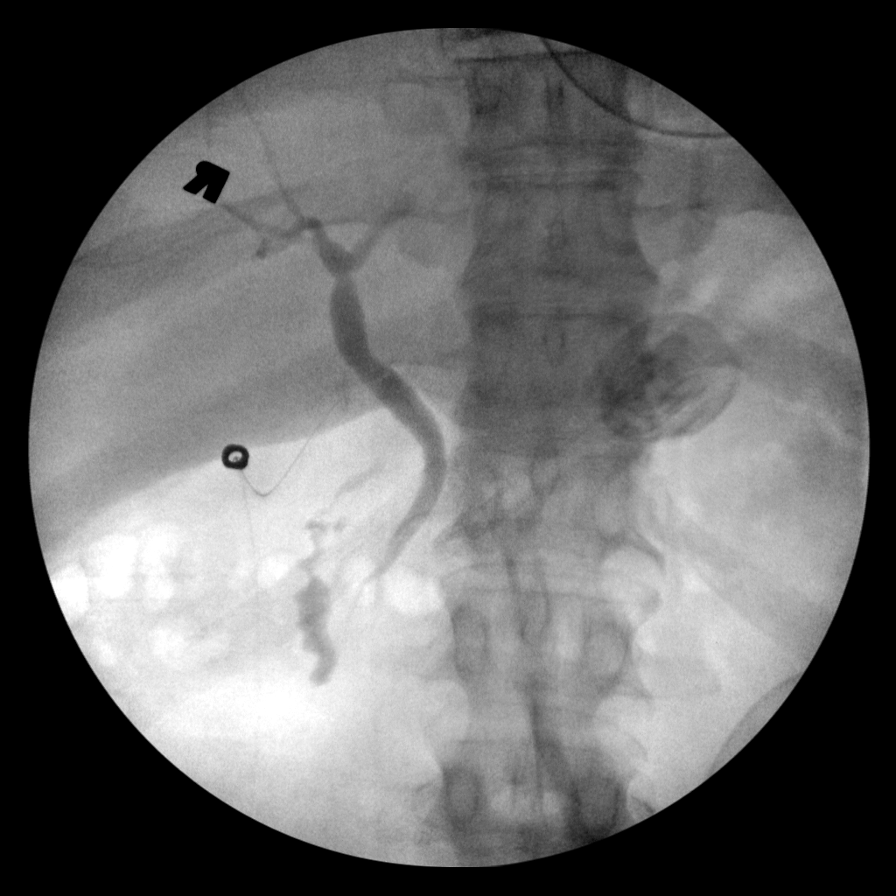
[im 1/3]
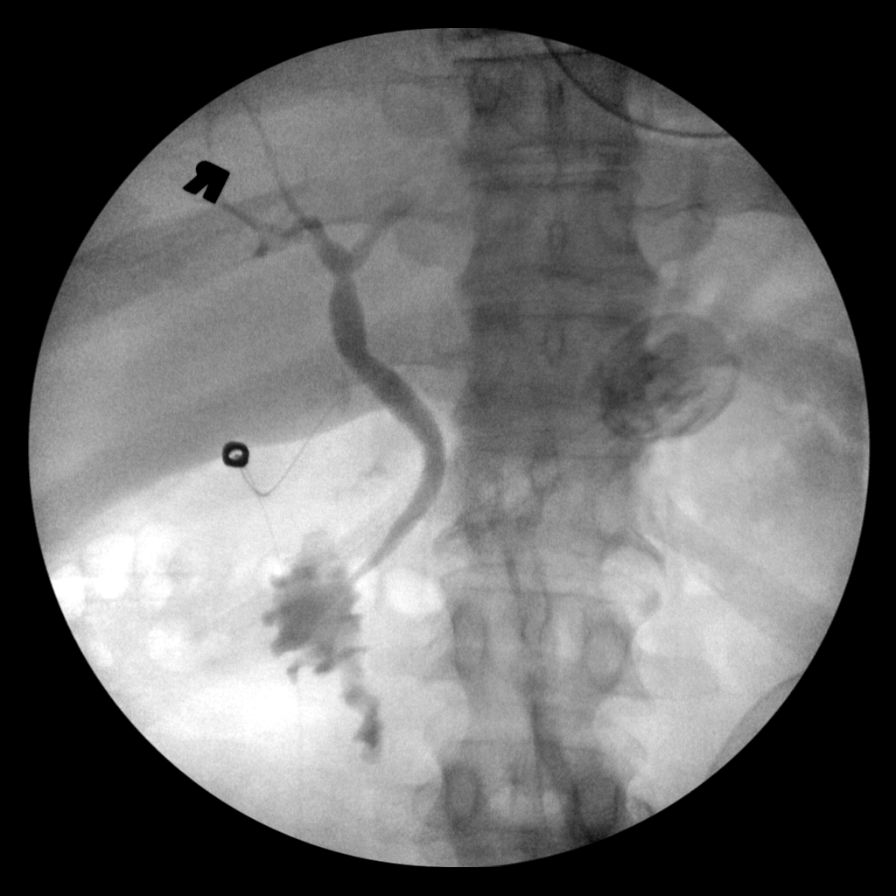
[im 1/3]
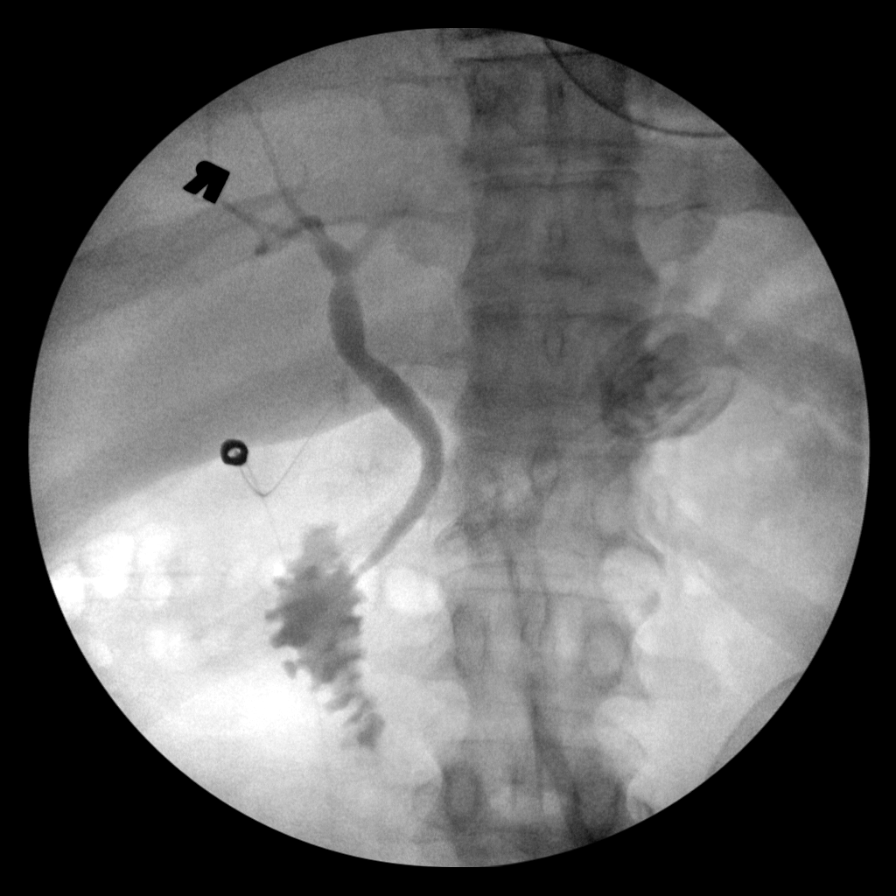
[im 2/3]
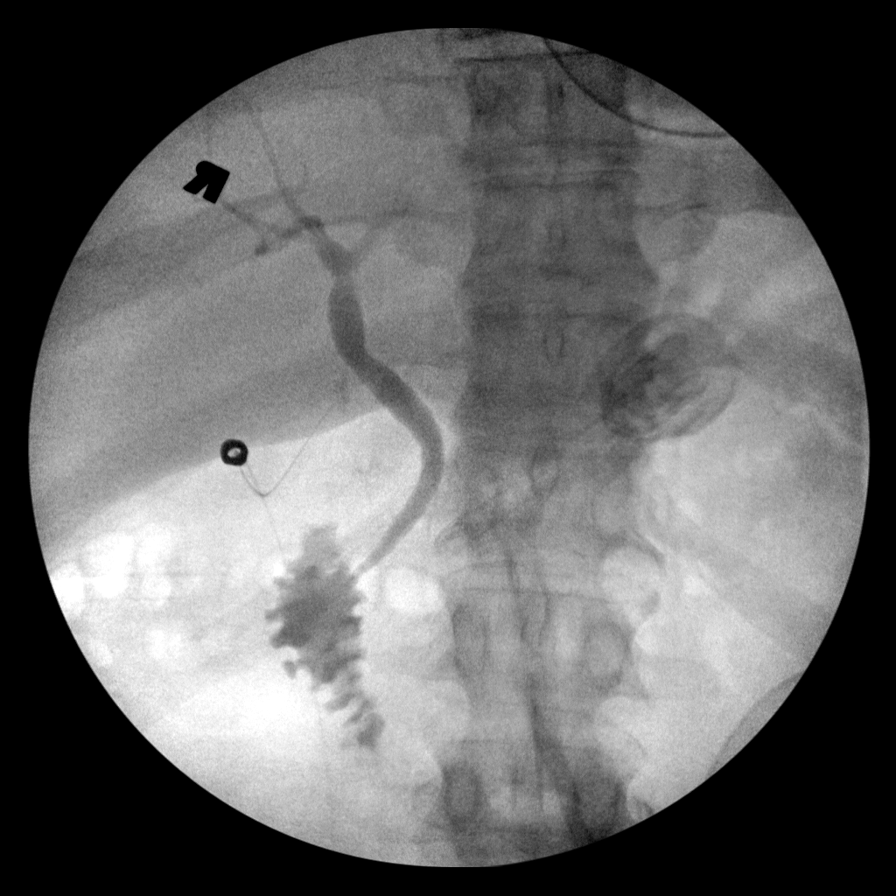
[im 3/3]
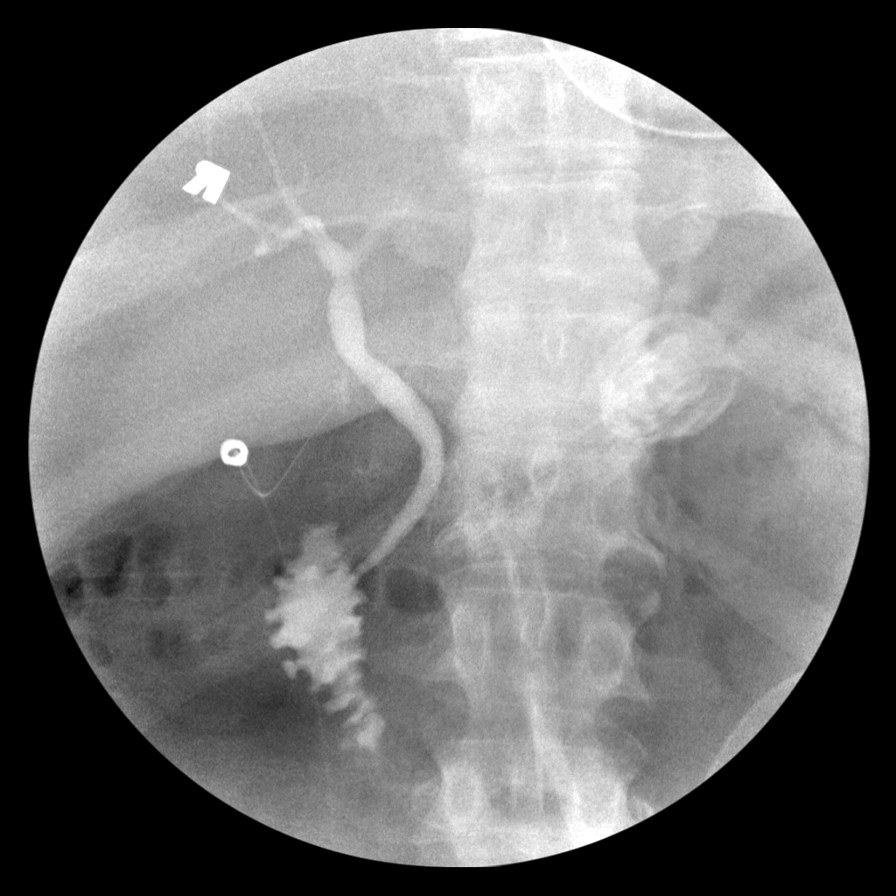

[6 of 6 positions shown; findings below may reference images not displayed]

FINDINGS: No persistent filling defects in the common duct.
Intrahepatic ducts are incompletely visualized, appearing
decompressed centrally. Contrast passes into the duodenum.

IMPRESSION

Negative for retained common duct stone.

## 2018-05-25 ENCOUNTER — Other Ambulatory Visit: Payer: Self-pay

## 2018-05-25 ENCOUNTER — Ambulatory Visit (INDEPENDENT_AMBULATORY_CARE_PROVIDER_SITE_OTHER): Payer: BLUE CROSS/BLUE SHIELD | Admitting: Podiatry

## 2018-05-25 ENCOUNTER — Ambulatory Visit (INDEPENDENT_AMBULATORY_CARE_PROVIDER_SITE_OTHER): Payer: BLUE CROSS/BLUE SHIELD

## 2018-05-25 ENCOUNTER — Encounter: Payer: Self-pay | Admitting: Podiatry

## 2018-05-25 DIAGNOSIS — M779 Enthesopathy, unspecified: Secondary | ICD-10-CM

## 2018-05-25 DIAGNOSIS — M7750 Other enthesopathy of unspecified foot: Secondary | ICD-10-CM

## 2018-05-25 DIAGNOSIS — M10072 Idiopathic gout, left ankle and foot: Secondary | ICD-10-CM | POA: Diagnosis not present

## 2018-05-25 MED ORDER — METHYLPREDNISOLONE 4 MG PO TABS: ORAL_TABLET | ORAL | 0 refills | Status: DC

## 2018-05-25 MED ORDER — MELOXICAM 15 MG PO TABS: 15.0000 mg | ORAL_TABLET | Freq: Every day | ORAL | 3 refills | Status: DC

## 2018-05-25 NOTE — Progress Notes (Signed)
  Subjective:  Patient ID: Barry Clark, male    DOB: [birth year],  MRN: [id number] HPI Chief Complaint  Patient presents with  . Ankle Pain    Medial and lateral ankle left - aching x 3 days, swollen, woke up Monday and couldn't even walk, tried ice and Ibuprofen-some better today  . New Patient (Initial Visit)    63 y.o. male presents with the above complaint.   ROS: Denies fever chills nausea vomiting muscle aches pains calf pain back pain chest pain shortness of breath.  Past Medical History:  Diagnosis Date  . Gallstones    Past Surgical History:  Procedure Laterality Date  . CHOLECYSTECTOMY  08/06/2011   Procedure: LAPAROSCOPIC CHOLECYSTECTOMY WITH INTRAOPERATIVE CHOLANGIOGRAM;  Surgeon: Caleen Essex III, MD;  Location: [facility] OR;  Service: General;  Laterality: N/A;  . COLONOSCOPY  07/2010    Current Outpatient Medications:  .  meloxicam (MOBIC) 15 MG tablet, Take 1 tablet (15 mg total) by mouth daily., Disp: 30 tablet, Rfl: 3 .  methylPREDNISolone (MEDROL) 4 MG tablet, Take as directed, Disp: 21 tablet, Rfl: 0  No Known Allergies Review of Systems Objective:  There were no vitals filed for this visit.  General: Well developed, nourished, in no acute distress, alert and oriented x3   Dermatological: Skin is warm, dry and supple bilateral. Nails x 10 are well maintained; remaining integument appears unremarkable at this time. There are no open sores, no preulcerative lesions, no rash or signs of infection present.  Vascular: Dorsalis Pedis artery and Posterior Tibial artery pedal pulses are 2/4 bilateral with immedate capillary fill time. Pedal hair growth present. No varicosities and no lower extremity edema present bilateral.   Neruologic: Grossly intact via light touch bilateral. Vibratory intact via tuning fork bilateral. Protective threshold with Semmes Wienstein monofilament intact to all pedal sites bilateral. Patellar and Achilles deep tendon reflexes 2+ bilateral. No  Babinski or clonus noted bilateral.   Musculoskeletal: No gross boney pedal deformities bilateral. No pain, crepitus, or limitation noted with foot and ankle range of motion bilateral. Muscular strength 5/5 in all groups tested bilateral.  Gait: Unassisted, Nonantalgic.    Radiographs:  Radiographs demonstrate some old osteoarthritic change to the medial aspect of the medial distal medial gutter however there is some soft tissue swelling in the Cager's triangle on posterior superior aspect of the calcaneus.  Assessment & Plan:   Assessment: Probable gouty capsulitis  Plan: At this point I started him on a Medrol Dosepak to be followed by meloxicam.  I like to follow-up with him in 2 to 4 weeks.  He will call with questions or concerns or if there are any worsening events.  Should this gentleman call stating that he has another flareup of gout or capsulitis he is to receive a requisition for blood work consisting of a CBC and arthritic profile including an ANA immediately.     Damarian Priola T. H[place], [place]

## 2018-06-22 ENCOUNTER — Other Ambulatory Visit: Payer: Self-pay

## 2018-06-22 ENCOUNTER — Ambulatory Visit: Payer: BLUE CROSS/BLUE SHIELD | Admitting: Podiatry

## 2018-06-22 ENCOUNTER — Encounter: Payer: Self-pay | Admitting: Podiatry

## 2018-06-22 VITALS — Temp 98.1°F

## 2018-06-22 DIAGNOSIS — M10072 Idiopathic gout, left ankle and foot: Secondary | ICD-10-CM | POA: Diagnosis not present

## 2018-06-22 NOTE — Progress Notes (Signed)
He presents today for follow-up of his gouty capsulitis left ankle.  He denies fever chills nausea vomiting muscle aches and pain states that is doing much better is 100% improved.  Objective: Vital signs are stable he is alert and oriented x3.  He has no pain on palpation of the previous area that was painful.  Does have flexible flatfoot deformity with some tenderness on palpation of the sinus tarsi.  Assessment: Pes planus history of probable gout.  Plan: At this point I would not recommend he get back to his regular routine continue the meloxicam for the next week or so then taper off I also recommended that should he develop any signs or symptoms of gout he is to notify us immediately so that we can request a CBC and an arthritic profile.

## 2018-06-29 ENCOUNTER — Ambulatory Visit: Payer: BLUE CROSS/BLUE SHIELD | Admitting: Podiatry

## 2018-08-19 ENCOUNTER — Telehealth: Payer: Self-pay

## 2018-08-19 DIAGNOSIS — M10072 Idiopathic gout, left ankle and foot: Secondary | ICD-10-CM

## 2018-08-19 NOTE — Telephone Encounter (Signed)
-----   Message from Arlyce Dice sent at 08/18/2018  4:02 PM EDT ----- Patient needs to have a order put in place to be able to have blood work done, for gout flare up. Call patient back 6/12 to have him come into office to pick up order please.

## 2018-08-19 NOTE — Telephone Encounter (Signed)
Per Dr. Stephenie Acres last office note, ok for patient to have labs done if has another gout flare up.  Patient notified to pick up prescription for labs via voice mail

## 2018-08-20 LAB — CBC WITH DIFFERENTIAL/PLATELET
Basophils Absolute: 0.1 10*3/uL (ref 0.0–0.2)
Basos: 1 %
EOS (ABSOLUTE): 0.1 10*3/uL (ref 0.0–0.4)
Eos: 1 %
Hematocrit: 42.5 % (ref 37.5–51.0)
Hemoglobin: 14.2 g/dL (ref 13.0–17.7)
Immature Grans (Abs): 0 10*3/uL (ref 0.0–0.1)
Immature Granulocytes: 0 %
Lymphocytes Absolute: 3.5 10*3/uL — ABNORMAL HIGH (ref 0.7–3.1)
Lymphs: 37 %
MCH: 28.3 pg (ref 26.6–33.0)
MCHC: 33.4 g/dL (ref 31.5–35.7)
MCV: 85 fL (ref 79–97)
Monocytes Absolute: 0.9 10*3/uL (ref 0.1–0.9)
Monocytes: 10 %
Neutrophils Absolute: 4.9 10*3/uL (ref 1.4–7.0)
Neutrophils: 51 %
Platelets: 199 10*3/uL (ref 150–450)
RBC: 5.01 x10E6/uL (ref 4.14–5.80)
RDW: 13.7 % (ref 11.6–15.4)
WBC: 9.5 10*3/uL (ref 3.4–10.8)

## 2018-08-20 LAB — ARTHRITIS PANEL
Anti Nuclear Antibody (ANA): NEGATIVE
Rhuematoid fact SerPl-aCnc: <10 IU/mL (ref 0.0–13.9)
Sed Rate: 21 mm/hr (ref 0–30)
Uric Acid: 7.6 mg/dL (ref 3.7–8.6)

## 2018-08-23 ENCOUNTER — Telehealth: Payer: Self-pay | Admitting: *Deleted

## 2018-08-23 NOTE — Telephone Encounter (Signed)
Pt called for results. I informed pt of Dr. Stephenie Acres review of results and asked pt how he was doing. Pt states he called this morning for an appt with Dr. Milinda Pointer tomorrow, because he is having more swelling and pain.

## 2018-08-23 NOTE — Telephone Encounter (Signed)
-----   Message from Garrel Ridgel, [place] sent at 08/23/2018  7:45 AM EDT ----- Blood work shows the uric acid was most likely high since the current reading is on the upper end of normal. Most likely gout.

## 2018-08-23 NOTE — Telephone Encounter (Signed)
Left message for pt to call for results  

## 2018-08-24 ENCOUNTER — Encounter: Payer: Self-pay | Admitting: Podiatry

## 2018-08-24 ENCOUNTER — Ambulatory Visit (INDEPENDENT_AMBULATORY_CARE_PROVIDER_SITE_OTHER): Payer: BC Managed Care – PPO | Admitting: Podiatry

## 2018-08-24 ENCOUNTER — Other Ambulatory Visit: Payer: Self-pay

## 2018-08-24 VITALS — Temp 96.8°F

## 2018-08-24 DIAGNOSIS — M7752 Other enthesopathy of left foot: Secondary | ICD-10-CM

## 2018-08-24 DIAGNOSIS — M778 Other enthesopathies, not elsewhere classified: Secondary | ICD-10-CM

## 2018-08-24 NOTE — Addendum Note (Signed)
Addended by: Clovis Riley E on: 08/24/2018 09:02 AM   Modules accepted: Level of Service

## 2018-08-24 NOTE — Progress Notes (Signed)
He presents today following up of a gout attack states that started last Thursday and has calmed down considerably since then.  Had blood work performed just the other day.  Objective: Mild edema hallux valgus deformity pain on palpation medially and laterally of the first metatarsal phalangeal joint left with some swelling and mild erythema.  Lab report does demonstrate a 7.6 uric acid consistent with trending down of the uric acid from a higher level.   Assessment: Gouty capsulitis  Plan: Periarticular injection after sterile Betadine skin prep 20 mg of Kenalog 5 mg of Marcaine.  Offered him a prednisone pack he declined should he call in requesting 1 we will prefer to give him a prednisone pack.

## 2018-09-19 DIAGNOSIS — Z20828 Contact with and (suspected) exposure to other viral communicable diseases: Secondary | ICD-10-CM | POA: Diagnosis not present

## 2018-09-19 DIAGNOSIS — Z7189 Other specified counseling: Secondary | ICD-10-CM | POA: Diagnosis not present

## 2018-09-26 DIAGNOSIS — R61 Generalized hyperhidrosis: Secondary | ICD-10-CM | POA: Diagnosis not present

## 2018-09-26 DIAGNOSIS — J988 Other specified respiratory disorders: Secondary | ICD-10-CM | POA: Diagnosis not present

## 2018-09-26 DIAGNOSIS — R6883 Chills (without fever): Secondary | ICD-10-CM | POA: Diagnosis not present

## 2018-09-26 DIAGNOSIS — Z20828 Contact with and (suspected) exposure to other viral communicable diseases: Secondary | ICD-10-CM | POA: Diagnosis not present

## 2018-11-03 DIAGNOSIS — M1A0720 Idiopathic chronic gout, left ankle and foot, without tophus (tophi): Secondary | ICD-10-CM | POA: Diagnosis not present

## 2018-11-03 DIAGNOSIS — E782 Mixed hyperlipidemia: Secondary | ICD-10-CM | POA: Diagnosis not present

## 2018-11-03 DIAGNOSIS — Z125 Encounter for screening for malignant neoplasm of prostate: Secondary | ICD-10-CM | POA: Diagnosis not present

## 2018-11-03 DIAGNOSIS — E291 Testicular hypofunction: Secondary | ICD-10-CM | POA: Diagnosis not present

## 2018-11-03 DIAGNOSIS — Z0000 Encounter for general adult medical examination without abnormal findings: Secondary | ICD-10-CM | POA: Diagnosis not present

## 2018-11-03 DIAGNOSIS — R6882 Decreased libido: Secondary | ICD-10-CM | POA: Diagnosis not present

## 2018-11-09 DIAGNOSIS — E291 Testicular hypofunction: Secondary | ICD-10-CM | POA: Diagnosis not present

## 2018-11-09 DIAGNOSIS — R739 Hyperglycemia, unspecified: Secondary | ICD-10-CM | POA: Diagnosis not present

## 2018-11-10 ENCOUNTER — Other Ambulatory Visit: Payer: Self-pay | Admitting: Podiatry

## 2019-02-08 DIAGNOSIS — E291 Testicular hypofunction: Secondary | ICD-10-CM | POA: Diagnosis not present

## 2019-02-20 ENCOUNTER — Other Ambulatory Visit: Payer: Self-pay

## 2019-02-20 DIAGNOSIS — Z20822 Contact with and (suspected) exposure to covid-19: Secondary | ICD-10-CM

## 2019-02-21 ENCOUNTER — Telehealth: Payer: Self-pay | Admitting: *Deleted

## 2019-02-21 ENCOUNTER — Telehealth: Payer: Self-pay

## 2019-02-21 LAB — NOVEL CORONAVIRUS, NAA: SARS-CoV-2, NAA: NOT DETECTED

## 2019-02-21 NOTE — Telephone Encounter (Signed)
Patient called for results .still pending . °

## 2019-02-21 NOTE — Telephone Encounter (Signed)
Caller advise that result not back yet  

## 2019-03-01 DIAGNOSIS — E291 Testicular hypofunction: Secondary | ICD-10-CM | POA: Diagnosis not present

## 2019-06-07 DIAGNOSIS — R030 Elevated blood-pressure reading, without diagnosis of hypertension: Secondary | ICD-10-CM | POA: Diagnosis not present

## 2019-06-07 DIAGNOSIS — R202 Paresthesia of skin: Secondary | ICD-10-CM | POA: Diagnosis not present

## 2019-08-29 ENCOUNTER — Other Ambulatory Visit: Payer: Self-pay | Admitting: Family Medicine

## 2020-01-09 ENCOUNTER — Ambulatory Visit: Payer: Self-pay | Attending: Internal Medicine

## 2020-01-09 DIAGNOSIS — Z23 Encounter for immunization: Secondary | ICD-10-CM

## 2020-01-09 NOTE — Progress Notes (Signed)
   Covid-19 Vaccination Clinic  Name:  Barry Clark    MRN: [id number] DOB: 1955-12-19  01/09/2020  Mr. Mazurowski was observed post Covid-19 immunization for 15 minutes without incident. He was provided with Vaccine Information Sheet and instruction to access the V-Safe system.   Mr. Conwell was instructed to call 911 with any severe reactions post vaccine: Marland Kitchen Difficulty breathing  . Swelling of face and throat  . A fast heartbeat  . A bad rash all over body  . Dizziness and weakness

## 2020-03-11 ENCOUNTER — Other Ambulatory Visit: Payer: Self-pay

## 2020-05-09 DIAGNOSIS — E291 Testicular hypofunction: Secondary | ICD-10-CM | POA: Diagnosis not present

## 2020-05-09 DIAGNOSIS — G5603 Carpal tunnel syndrome, bilateral upper limbs: Secondary | ICD-10-CM | POA: Diagnosis not present

## 2020-05-09 DIAGNOSIS — Z23 Encounter for immunization: Secondary | ICD-10-CM | POA: Diagnosis not present

## 2020-05-22 DIAGNOSIS — R059 Cough, unspecified: Secondary | ICD-10-CM | POA: Diagnosis not present

## 2020-05-23 DIAGNOSIS — R059 Cough, unspecified: Secondary | ICD-10-CM | POA: Diagnosis not present

## 2020-05-23 DIAGNOSIS — Z20822 Contact with and (suspected) exposure to covid-19: Secondary | ICD-10-CM | POA: Diagnosis not present

## 2020-07-10 DIAGNOSIS — G5601 Carpal tunnel syndrome, right upper limb: Secondary | ICD-10-CM | POA: Diagnosis not present

## 2020-07-23 DIAGNOSIS — R059 Cough, unspecified: Secondary | ICD-10-CM | POA: Diagnosis not present

## 2020-07-24 DIAGNOSIS — R059 Cough, unspecified: Secondary | ICD-10-CM | POA: Diagnosis not present

## 2020-07-24 DIAGNOSIS — U071 COVID-19: Secondary | ICD-10-CM | POA: Diagnosis not present

## 2020-09-19 DIAGNOSIS — G5601 Carpal tunnel syndrome, right upper limb: Secondary | ICD-10-CM | POA: Diagnosis not present

## 2020-09-19 DIAGNOSIS — M65351 Trigger finger, right little finger: Secondary | ICD-10-CM | POA: Diagnosis not present

## 2020-10-31 ENCOUNTER — Ambulatory Visit: Payer: BC Managed Care – PPO | Admitting: Neurology

## 2020-11-25 DIAGNOSIS — Z0000 Encounter for general adult medical examination without abnormal findings: Secondary | ICD-10-CM | POA: Diagnosis not present

## 2020-11-25 DIAGNOSIS — R7303 Prediabetes: Secondary | ICD-10-CM | POA: Diagnosis not present

## 2020-11-25 DIAGNOSIS — Z125 Encounter for screening for malignant neoplasm of prostate: Secondary | ICD-10-CM | POA: Diagnosis not present

## 2020-11-25 DIAGNOSIS — M1A0720 Idiopathic chronic gout, left ankle and foot, without tophus (tophi): Secondary | ICD-10-CM | POA: Diagnosis not present

## 2020-11-25 DIAGNOSIS — E782 Mixed hyperlipidemia: Secondary | ICD-10-CM | POA: Diagnosis not present

## 2020-11-25 DIAGNOSIS — F1021 Alcohol dependence, in remission: Secondary | ICD-10-CM | POA: Diagnosis not present

## 2020-11-25 DIAGNOSIS — E291 Testicular hypofunction: Secondary | ICD-10-CM | POA: Diagnosis not present

## 2020-11-25 DIAGNOSIS — Z5181 Encounter for therapeutic drug level monitoring: Secondary | ICD-10-CM | POA: Diagnosis not present

## 2020-11-25 DIAGNOSIS — F5221 Male erectile disorder: Secondary | ICD-10-CM | POA: Diagnosis not present

## 2021-05-14 DIAGNOSIS — M545 Low back pain, unspecified: Secondary | ICD-10-CM | POA: Diagnosis not present

## 2021-05-19 DIAGNOSIS — M461 Sacroiliitis, not elsewhere classified: Secondary | ICD-10-CM | POA: Diagnosis not present

## 2021-05-19 DIAGNOSIS — M9905 Segmental and somatic dysfunction of pelvic region: Secondary | ICD-10-CM | POA: Diagnosis not present

## 2021-05-19 DIAGNOSIS — M9904 Segmental and somatic dysfunction of sacral region: Secondary | ICD-10-CM | POA: Diagnosis not present

## 2021-05-19 DIAGNOSIS — M9903 Segmental and somatic dysfunction of lumbar region: Secondary | ICD-10-CM | POA: Diagnosis not present

## 2021-05-20 DIAGNOSIS — M9903 Segmental and somatic dysfunction of lumbar region: Secondary | ICD-10-CM | POA: Diagnosis not present

## 2021-05-20 DIAGNOSIS — M9905 Segmental and somatic dysfunction of pelvic region: Secondary | ICD-10-CM | POA: Diagnosis not present

## 2021-05-20 DIAGNOSIS — M461 Sacroiliitis, not elsewhere classified: Secondary | ICD-10-CM | POA: Diagnosis not present

## 2021-05-20 DIAGNOSIS — M9904 Segmental and somatic dysfunction of sacral region: Secondary | ICD-10-CM | POA: Diagnosis not present

## 2021-05-22 DIAGNOSIS — M461 Sacroiliitis, not elsewhere classified: Secondary | ICD-10-CM | POA: Diagnosis not present

## 2021-05-22 DIAGNOSIS — M9904 Segmental and somatic dysfunction of sacral region: Secondary | ICD-10-CM | POA: Diagnosis not present

## 2021-05-22 DIAGNOSIS — M9903 Segmental and somatic dysfunction of lumbar region: Secondary | ICD-10-CM | POA: Diagnosis not present

## 2021-05-22 DIAGNOSIS — M9905 Segmental and somatic dysfunction of pelvic region: Secondary | ICD-10-CM | POA: Diagnosis not present

## 2021-05-26 DIAGNOSIS — M9904 Segmental and somatic dysfunction of sacral region: Secondary | ICD-10-CM | POA: Diagnosis not present

## 2021-05-26 DIAGNOSIS — M461 Sacroiliitis, not elsewhere classified: Secondary | ICD-10-CM | POA: Diagnosis not present

## 2021-05-26 DIAGNOSIS — M9903 Segmental and somatic dysfunction of lumbar region: Secondary | ICD-10-CM | POA: Diagnosis not present

## 2021-05-26 DIAGNOSIS — M9905 Segmental and somatic dysfunction of pelvic region: Secondary | ICD-10-CM | POA: Diagnosis not present

## 2021-05-29 DIAGNOSIS — M9904 Segmental and somatic dysfunction of sacral region: Secondary | ICD-10-CM | POA: Diagnosis not present

## 2021-05-29 DIAGNOSIS — M9903 Segmental and somatic dysfunction of lumbar region: Secondary | ICD-10-CM | POA: Diagnosis not present

## 2021-05-29 DIAGNOSIS — M9905 Segmental and somatic dysfunction of pelvic region: Secondary | ICD-10-CM | POA: Diagnosis not present

## 2021-05-29 DIAGNOSIS — M461 Sacroiliitis, not elsewhere classified: Secondary | ICD-10-CM | POA: Diagnosis not present

## 2021-11-26 DIAGNOSIS — H524 Presbyopia: Secondary | ICD-10-CM | POA: Diagnosis not present

## 2021-12-04 DIAGNOSIS — Z23 Encounter for immunization: Secondary | ICD-10-CM | POA: Diagnosis not present

## 2021-12-04 DIAGNOSIS — Z6841 Body Mass Index (BMI) 40.0 and over, adult: Secondary | ICD-10-CM | POA: Diagnosis not present

## 2021-12-04 DIAGNOSIS — Z0000 Encounter for general adult medical examination without abnormal findings: Secondary | ICD-10-CM | POA: Diagnosis not present

## 2021-12-04 DIAGNOSIS — F1021 Alcohol dependence, in remission: Secondary | ICD-10-CM | POA: Diagnosis not present

## 2021-12-04 DIAGNOSIS — Z125 Encounter for screening for malignant neoplasm of prostate: Secondary | ICD-10-CM | POA: Diagnosis not present

## 2021-12-04 DIAGNOSIS — E782 Mixed hyperlipidemia: Secondary | ICD-10-CM | POA: Diagnosis not present

## 2021-12-04 DIAGNOSIS — E291 Testicular hypofunction: Secondary | ICD-10-CM | POA: Diagnosis not present

## 2022-06-10 DIAGNOSIS — E782 Mixed hyperlipidemia: Secondary | ICD-10-CM | POA: Diagnosis not present

## 2022-06-10 DIAGNOSIS — Z6841 Body Mass Index (BMI) 40.0 and over, adult: Secondary | ICD-10-CM | POA: Diagnosis not present

## 2022-06-10 DIAGNOSIS — I10 Essential (primary) hypertension: Secondary | ICD-10-CM | POA: Diagnosis not present

## 2022-06-10 DIAGNOSIS — F1021 Alcohol dependence, in remission: Secondary | ICD-10-CM | POA: Diagnosis not present

## 2022-06-10 DIAGNOSIS — R7303 Prediabetes: Secondary | ICD-10-CM | POA: Diagnosis not present

## 2022-12-15 DIAGNOSIS — E291 Testicular hypofunction: Secondary | ICD-10-CM | POA: Diagnosis not present

## 2022-12-15 DIAGNOSIS — F1021 Alcohol dependence, in remission: Secondary | ICD-10-CM | POA: Diagnosis not present

## 2022-12-15 DIAGNOSIS — R7303 Prediabetes: Secondary | ICD-10-CM | POA: Diagnosis not present

## 2022-12-15 DIAGNOSIS — Z0000 Encounter for general adult medical examination without abnormal findings: Secondary | ICD-10-CM | POA: Diagnosis not present

## 2022-12-15 DIAGNOSIS — Z125 Encounter for screening for malignant neoplasm of prostate: Secondary | ICD-10-CM | POA: Diagnosis not present

## 2022-12-15 DIAGNOSIS — Z23 Encounter for immunization: Secondary | ICD-10-CM | POA: Diagnosis not present

## 2022-12-15 DIAGNOSIS — F5221 Male erectile disorder: Secondary | ICD-10-CM | POA: Diagnosis not present

## 2022-12-15 DIAGNOSIS — E782 Mixed hyperlipidemia: Secondary | ICD-10-CM | POA: Diagnosis not present

## 2022-12-15 DIAGNOSIS — N1831 Chronic kidney disease, stage 3a: Secondary | ICD-10-CM | POA: Diagnosis not present

## 2022-12-15 DIAGNOSIS — I10 Essential (primary) hypertension: Secondary | ICD-10-CM | POA: Diagnosis not present

## 2022-12-15 DIAGNOSIS — M1A0720 Idiopathic chronic gout, left ankle and foot, without tophus (tophi): Secondary | ICD-10-CM | POA: Diagnosis not present

## 2023-03-09 DIAGNOSIS — E782 Mixed hyperlipidemia: Secondary | ICD-10-CM | POA: Diagnosis not present

## 2023-03-09 DIAGNOSIS — N1831 Chronic kidney disease, stage 3a: Secondary | ICD-10-CM | POA: Diagnosis not present

## 2023-03-09 DIAGNOSIS — R209 Unspecified disturbances of skin sensation: Secondary | ICD-10-CM | POA: Diagnosis not present

## 2023-03-09 DIAGNOSIS — M1A0720 Idiopathic chronic gout, left ankle and foot, without tophus (tophi): Secondary | ICD-10-CM | POA: Diagnosis not present

## 2023-04-08 ENCOUNTER — Other Ambulatory Visit: Payer: Self-pay | Admitting: *Deleted

## 2023-04-08 DIAGNOSIS — R209 Unspecified disturbances of skin sensation: Secondary | ICD-10-CM

## 2023-04-20 NOTE — Progress Notes (Unsigned)
Office Note     CC: Cold left lower leg Requesting Provider:  Soundra Pilon, FNP  HPI: Barry Clark is a 68 y.o. (May 27, 1955) male presenting at the request of .Gordy Savers, MD (Inactive) for evaluation of cold left lower extremity.  On exam, Barry Clark was doing well.  A native of [place], he moved down to [place] years ago.  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  He is married, and his wife is a Warden/ranger, who will be retiring this year, after 30 years of service.  Barry Clark has appreciated some waxing waning left lower extremity coolness that is located at the ankle.  This will be present for a day, and then resolve for several.  Barry Clark has no history of back problems.  No history of peripheral arterial disease.  He has no symptoms in the right lower extremity.  Denies claudication, ischemic rest pain, tissue loss.  In arteries words, he is trying to be diligent about his health as he is getting older.   Past Medical History:  Diagnosis Date   Gallstones     Past Surgical History:  Procedure Laterality Date   CHOLECYSTECTOMY  08/06/2011   Procedure: LAPAROSCOPIC CHOLECYSTECTOMY WITH INTRAOPERATIVE CHOLANGIOGRAM;  Surgeon: Robyne Askew, MD;  Location: [facility] OR;  Service: General;  Laterality: N/A;   COLONOSCOPY  07/2010    Social History   Socioeconomic History   Marital status: Married    Spouse name: Not on file   Number of children: Not on file   Years of education: Not on file   Highest education level: Not on file  Occupational History   Not on file  Tobacco Use   Smoking status: Former    Current packs/day: 0.00    Average packs/day: 0.3 packs/day for 0.5 years (0.1 ttl pk-yrs)    Types: Cigarettes    Start date: 01/12/1977    Quit date: 07/13/1977    Years since quitting: 45.8   Smokeless tobacco: Never   Tobacco comments:    drugs and alcohol quit 12 yrs ago after 10 yrs use  Substance and Sexual  Activity   Alcohol use: No    Comment: occasional   Drug use: No   Sexual activity: Not on file  Other Topics Concern   Not on file  Social History Narrative   Not on file   Social Drivers of Health   Financial Resource Strain: Not on file  Food Insecurity: Not on file  Transportation Needs: Not on file  Physical Activity: Not on file  Stress: Not on file  Social Connections: Not on file  Intimate Partner Violence: Not on file   Family History  Problem Relation Age of Onset   Cancer Sister        breast    Current Outpatient Medications  Medication Sig Dispense Refill   meloxicam (MOBIC) 15 MG tablet TAKE 1 TABLET BY MOUTH EVERY DAY 30 tablet 3   No current facility-administered medications for this visit.    No Known Allergies   REVIEW OF SYSTEMS:  [X]  denotes positive finding, [ ]  denotes negative finding Cardiac  Comments:  Chest pain or chest pressure:    Shortness of breath upon exertion:    Short of breath when lying flat:    Irregular heart rhythm:        Vascular    Pain in calf, thigh, or hip brought on by ambulation:  Pain in feet at night that wakes you up from your sleep:     Blood clot in your veins:    Leg swelling:         Pulmonary    Oxygen at home:    Productive cough:     Wheezing:         Neurologic    Sudden weakness in arms or legs:     Sudden numbness in arms or legs:     Sudden onset of difficulty speaking or slurred speech:    Temporary loss of vision in one eye:     Problems with dizziness:         Gastrointestinal    Blood in stool:     Vomited blood:         Genitourinary    Burning when urinating:     Blood in urine:        Psychiatric    Major depression:         Hematologic    Bleeding problems:    Problems with blood clotting too easily:        Skin    Rashes or ulcers:        Constitutional    Fever or chills:      PHYSICAL EXAMINATION:  There were no vitals filed for this visit.  General:  WDWN  in NAD; vital signs documented above Gait: Not observed HENT: WNL, normocephalic Pulmonary: normal non-labored breathing , without wheezing Cardiac: regular HR Abdomen: soft, NT, no masses Skin: without rashes Vascular Exam/Pulses:  Right Left  Radial 2+ (normal) 2+ (normal)  Ulnar    Femoral    Popliteal    DP 2+ (normal) 2+ (normal)  PT 2+ (normal) 2+ (normal)   Extremities: without ischemic changes, without Gangrene , without cellulitis; without open wounds;  Musculoskeletal: no muscle wasting or atrophy  Neurologic: A&O X 3;  No focal weakness or paresthesias are detected Psychiatric:  The pt has Normal affect.   Non-Invasive Vascular Imaging:   ABI Findings:  +---------+------------------+-----+---------+--------+  Right   Rt Pressure (mmHg)IndexWaveform Comment   +---------+------------------+-----+---------+--------+  Brachial 138                                       +---------+------------------+-----+---------+--------+  ATA     168               1.18 triphasic          +---------+------------------+-----+---------+--------+  PTA     164               1.15 triphasic          +---------+------------------+-----+---------+--------+  Great Toe108               0.76                    +---------+------------------+-----+---------+--------+   +---------+------------------+-----+---------+-------+  Left    Lt Pressure (mmHg)IndexWaveform Comment  +---------+------------------+-----+---------+-------+  Brachial 142                                      +---------+------------------+-----+---------+-------+  ATA     174               1.23 triphasic         +---------+------------------+-----+---------+-------+  PTA  182               1.28 triphasic         +---------+------------------+-----+---------+-------+  Great Toe142               1.00                    +---------+------------------+-----+---------+-------+   +-------+-----------+-----------+------------+------------+  ABI/TBIToday's ABIToday's TBIPrevious ABIPrevious TBI  +-------+-----------+-----------+------------+------------+  Right 1.18       0.76                                 +-------+-----------+-----------+------------+------------+  Left  1.28       1                                    +-------+-----------+-----------+------------+------------+          ASSESSMENT/PLAN: Barry Clark is a 68 y.o. male presenting with waxing and waning coolness sensation in the left lower extremity.  On physical exam, he had a palpable pulse in the feet bilaterally. ABIs were reviewed, and found to be normal. Symptoms are inconsistent with neuropathy.   I had a long conversation with Barry Clark regarding the above.  He has normal perfusion to bilateral lower extremities.  I do not have an etiology as to why he is having this waxing waning sensation.  We discussed that if it continues, or if numbness is appreciated, he may benefit from neurosurgical evaluation of the back as this could be musculoskeletal in etiology.  Barry Clark can follow-up with me as needed.   Victorino Sparrow, MD [facility] [phone] Total time of patient care including pre-visit research, consultation, and documentation greater than 30 minutes

## 2023-04-22 ENCOUNTER — Encounter: Payer: Self-pay | Admitting: Vascular Surgery

## 2023-04-22 ENCOUNTER — Ambulatory Visit (HOSPITAL_COMMUNITY)
Admission: RE | Admit: 2023-04-22 | Discharge: 2023-04-22 | Disposition: A | Discharge status: Home / Self Care | Payer: Medicare Other | Source: Ambulatory Visit | Attending: Vascular Surgery | Admitting: Vascular Surgery

## 2023-04-22 ENCOUNTER — Ambulatory Visit: Payer: Medicare Other | Admitting: Vascular Surgery

## 2023-04-22 VITALS — BP 134/82 | HR 62 | Temp 98.1°F | Wt 322.0 lb

## 2023-04-22 DIAGNOSIS — R209 Unspecified disturbances of skin sensation: Secondary | ICD-10-CM | POA: Insufficient documentation

## 2023-04-22 LAB — VAS US ABI WITH/WO TBI
Left ABI: 1.28
Right ABI: 1.18

## 2023-05-11 ENCOUNTER — Ambulatory Visit: Payer: Self-pay | Admitting: Dermatology

## 2023-05-11 ENCOUNTER — Encounter: Payer: Self-pay | Admitting: Dermatology

## 2023-05-11 VITALS — BP 133/81 | HR 70

## 2023-05-11 DIAGNOSIS — D485 Neoplasm of uncertain behavior of skin: Secondary | ICD-10-CM

## 2023-05-11 DIAGNOSIS — L729 Follicular cyst of the skin and subcutaneous tissue, unspecified: Secondary | ICD-10-CM | POA: Diagnosis not present

## 2023-05-11 DIAGNOSIS — D492 Neoplasm of unspecified behavior of bone, soft tissue, and skin: Secondary | ICD-10-CM | POA: Diagnosis not present

## 2023-05-11 DIAGNOSIS — L918 Other hypertrophic disorders of the skin: Secondary | ICD-10-CM

## 2023-05-11 DIAGNOSIS — L738 Other specified follicular disorders: Secondary | ICD-10-CM | POA: Diagnosis not present

## 2023-05-11 DIAGNOSIS — L723 Sebaceous cyst: Secondary | ICD-10-CM

## 2023-05-11 DIAGNOSIS — L739 Follicular disorder, unspecified: Secondary | ICD-10-CM | POA: Diagnosis not present

## 2023-05-11 DIAGNOSIS — L919 Hypertrophic disorder of the skin, unspecified: Secondary | ICD-10-CM | POA: Diagnosis not present

## 2023-05-11 NOTE — Patient Instructions (Addendum)

## 2023-05-11 NOTE — Progress Notes (Signed)
 New Patient Visit   Subjective  Barry Clark is a 68 y.o. male who presents for the following: Skin tag and cyst  Patient states he  has skin tags located at the face that he would like to have examined. Patient reports the areas have been there for several years. He reports the areas are not bothersome. Patient reports   previously been treated for these areas.    The following portions of the chart were reviewed this encounter and updated as appropriate: medications, allergies, medical history  Review of Systems:  No other skin or systemic complaints except as noted in HPI or Assessment and Plan.  Objective  Well appearing patient in no apparent distress; mood and affect are within normal limits.  A focused examination was performed of the following areas: Face  Relevant exam findings are noted in the Assessment and Plan.                      Left Malar Cheek 4 mm pink brown papule Right Lower Eyelid 4 mm pedunculated papule  Assessment & Plan    Suspicious Skin Lesion on Left Malar Cheek Assessment: 4mm pink-brown papule on the left malar cheek. Differential diagnosis includes pigmented basal cell carcinoma. Plan:   Biopsy of the lesion (marked as "A").   Send for pathological examination.   Inform patient of results via MyChart if benign, call if abnormal and requires further treatment.   Post-procedure care: Keep area clean with soap and water, apply Aquaphor with or without band-aid.  Skin Lesion on Right Lower Eyelid Assessment: 4mm pedunculated round papule on the right lower eyelid. Differential diagnosis includes skin tag versus dermal nevus. Plan:   Biopsy of the lesion (marked as "B").   Send for pathological examination.   Inform patient of results via MyChart if benign, call if abnormal and requires further treatment.   Post-procedure care: Keep area clean with soap and water, apply Aquaphor with or without band-aid.  Large Cystic Lesion on  Right Lateral Thigh Assessment: 6cm x 5cm cystic lesion on the right lateral thigh, present for 3-4 years with recent growth. No drainage reported. Suspected to be a benign cyst, but further imaging is required to rule out other possibilities. Plan:   Order ultrasound of right lateral thigh to confirm cystic nature and absence of blood supply.   Patient to schedule ultrasound at [facility] location.   Upon confirmation of benign cyst, refer to Dr. Pasi for surgical excision.   Informed patient about potential scar length and risks (complications, keloids, infection).   Advised patient to consider symptom burden and cosmetic concerns before deciding on excision.  NEOPLASM OF UNCERTAIN BEHAVIOR OF SKIN (2) Left Malar Cheek Epidermal / dermal shaving  Lesion diameter (cm):  0.4 Informed consent: discussed and consent obtained   Timeout: patient name, date of birth, surgical site, and procedure verified   Procedure prep:  Patient was prepped and draped in usual sterile fashion Prep type:  Isopropyl alcohol Anesthesia: the lesion was anesthetized in a standard fashion   Anesthetic:  1% lidocaine w/ epinephrine 1-100,000 buffered w/ 8.4% NaHCO3 Instrument used: DermaBlade   Hemostasis achieved with: aluminum chloride   Outcome: patient tolerated procedure well   Post-procedure details: sterile dressing applied and wound care instructions given   Dressing type: petrolatum   Additional details:  Pt aware that benign results will be sent to mychart and the staff will call abnormal results will Specimen A -  Surgical pathology Differential Diagnosis: Pigmented BCC vs Other  Check Margins: No Right Lower Eyelid Epidermal / dermal shaving  Lesion diameter (cm):  0.4 Informed consent: discussed and consent obtained   Timeout: patient name, date of birth, surgical site, and procedure verified   Procedure prep:  Patient was prepped and draped in usual sterile fashion Prep type:   Isopropyl alcohol Anesthesia: the lesion was anesthetized in a standard fashion   Anesthetic:  1% lidocaine w/ epinephrine 1-100,000 buffered w/ 8.4% NaHCO3 Instrument used: DermaBlade   Hemostasis achieved with: aluminum chloride   Outcome: patient tolerated procedure well   Post-procedure details: sterile dressing applied and wound care instructions given   Dressing type: petrolatum   Additional details:  Pt aware that benign results will be sent to mychart and the staff will call abnormal results will Specimen B - Surgical pathology Differential Diagnosis: Skin tag vs DN  Check Margins: No  No follow-ups on file.  Documentation: I have reviewed the above documentation for accuracy and completeness, and I agree with the above.  Stasia Cavalier, am acting as scribe for Langston Reusing, DO.   Langston Reusing, DO

## 2023-05-12 LAB — SURGICAL PATHOLOGY

## 2023-05-13 ENCOUNTER — Encounter: Payer: Self-pay | Admitting: Dermatology

## 2023-05-13 NOTE — Progress Notes (Signed)
 Hi [name]  Dr. Onalee Hua reviewed your biopsy results and they showed the spot removed was benign (not cancerous).  No additional treatment is required.  The detailed report is available to view in MyChart.  Have a great day!  Kind Regards,  Dr. Kermit Balo Care Team

## 2023-05-13 NOTE — Telephone Encounter (Signed)
I'm not sure what the question is...

## 2023-05-20 ENCOUNTER — Ambulatory Visit (HOSPITAL_COMMUNITY)
Admission: RE | Admit: 2023-05-20 | Discharge: 2023-05-20 | Disposition: A | Discharge status: Home / Self Care | Source: Ambulatory Visit | Attending: Dermatology | Admitting: Dermatology

## 2023-05-20 DIAGNOSIS — R2241 Localized swelling, mass and lump, right lower limb: Secondary | ICD-10-CM | POA: Diagnosis not present

## 2023-05-20 DIAGNOSIS — L723 Sebaceous cyst: Secondary | ICD-10-CM

## 2023-05-25 NOTE — Telephone Encounter (Signed)
 Please let him know that the ultrasound will determin if he will have the surgery here at our office with Dr Caralyn Guile or if we will have to refer him to a general surgeon and have it removed at the hospital. Once I have the [place] results we will call him to let him know the next steps.  Thanks, Dr. Algis Downs

## 2023-06-07 NOTE — Progress Notes (Signed)
 Hi Hollie,  [place] confirmed a cysts with no internal vascularity.  Please call patient and relay results and help him schedule SE with Dr Caralyn Guile if he chooses to have it removed.  Cysts are benign and do not need to be removed unless it is causing discomfort.  Thanks!  - Dr. Onalee Hua

## 2023-06-09 ENCOUNTER — Other Ambulatory Visit: Payer: Self-pay

## 2023-06-10 ENCOUNTER — Telehealth: Payer: Self-pay

## 2023-06-10 ENCOUNTER — Encounter: Payer: Self-pay | Admitting: Dermatology

## 2023-06-10 NOTE — Telephone Encounter (Signed)
 Left message for patient to call office for [place] results/hd

## 2023-06-10 NOTE — Telephone Encounter (Signed)
 Pt had called requesting [place] results so I called and l/m for pt and told him it's ok to sched excision with dr. Caralyn Guile

## 2023-06-10 NOTE — Telephone Encounter (Signed)
-----   Message from Langston Reusing sent at 06/07/2023  6:16 AM EDT ----- Hi Barry Clark,  [place] confirmed a cysts with no internal vascularity.  Please call patient and relay results and help him schedule SE with Dr Caralyn Guile if he chooses to have it removed.  Cysts are benign and do not need to be removed unless it is causing discomfort.  Thanks !  - Dr. Onalee Hua

## 2023-07-26 ENCOUNTER — Encounter: Payer: Self-pay | Admitting: Dermatology

## 2023-07-28 ENCOUNTER — Encounter: Payer: Self-pay | Admitting: Dermatology

## 2023-07-28 ENCOUNTER — Ambulatory Visit: Admitting: Dermatology

## 2023-07-28 DIAGNOSIS — D492 Neoplasm of unspecified behavior of bone, soft tissue, and skin: Secondary | ICD-10-CM

## 2023-07-28 DIAGNOSIS — L720 Epidermal cyst: Secondary | ICD-10-CM | POA: Diagnosis not present

## 2023-07-28 DIAGNOSIS — D485 Neoplasm of uncertain behavior of skin: Secondary | ICD-10-CM

## 2023-07-28 NOTE — Patient Instructions (Signed)

## 2023-07-28 NOTE — Progress Notes (Signed)
 Follow-Up Visit   Subjective  Barry Clark is a 68 y.o. male who presents for the following: Excision of a subcutaneous nodule of the right thigh, referred by Dr. Myrtie Atkinson.   The following portions of the chart were reviewed this encounter and updated as appropriate: medications, allergies, medical history  Review of Systems:  No other skin or systemic complaints except as noted in HPI or Assessment and Plan.  Objective  Well appearing patient in no apparent distress; mood and affect are within normal limits.  A focused examination was performed of the following areas: Right thigh Relevant physical exam findings are noted in the Assessment and Plan.   Right Thigh 8.2 x 7. 0 cm SQ nodule   Assessment & Plan   NEOPLASM OF UNCERTAIN BEHAVIOR OF SKIN Right Thigh Skin excision  Excision method:  elliptical Lesion length (cm):  8.2 Lesion width (cm):  7 Margin per side (cm):  0.1 Total excision diameter (cm):  8.4 Informed consent: discussed and consent obtained   Timeout: patient name, date of birth, surgical site, and procedure verified   Procedure prep:  Patient was prepped and draped in usual sterile fashion Prep type:  Isopropyl alcohol and povidone-iodine Anesthesia: the lesion was anesthetized in a standard fashion   Anesthetic:  1% lidocaine  w/ epinephrine  1-100,000 buffered w/ 8.4% NaHCO3 Instrument used: #15 blade   Hemostasis achieved with: pressure   Hemostasis achieved with comment:  Electrocautery Outcome: patient tolerated procedure well with no complications   Post-procedure details: sterile dressing applied and wound care instructions given   Dressing type: bandage and pressure dressing (mupirocin)    Skin repair Complexity:  Complex Final length (cm):  11 Informed consent: discussed and consent obtained   Timeout: patient name, date of birth, surgical site, and procedure verified   Procedure prep:  Patient was prepped and draped in usual sterile  fashion Prep type:  Chlorhexidine  Anesthesia: the lesion was anesthetized in a standard fashion   Anesthetic:  1% lidocaine  w/ epinephrine  1-100,000 buffered w/ 8.4% NaHCO3 Reason for type of repair: reduce tension to allow closure, reduce the risk of dehiscence, infection, and necrosis, reduce subcutaneous dead space and avoid a hematoma, allow closure of the large defect, preserve normal anatomy, preserve normal anatomical and functional relationships and enhance both functionality and cosmetic results   Undermining: area extensively undermined   Undermining comment:  Undermining defect  Subcutaneous layers (deep stitches):  Suture size:  2-0 and 3-0 Suture type: PDS (polydioxanone)   Stitches:  Buried vertical mattress (inverted dermal) Fine/surface layer approximation (top stitches):  Suture type: cyanoacrylate tissue glue   Suture type comment:  Steristrips Suture removal (days):  0 Hemostasis achieved with: suture and pressure Outcome: patient tolerated procedure well with no complications   Post-procedure details: sterile dressing applied and wound care instructions given   Dressing type: bandage and pressure dressing (mupirocin)   Specimen 1 - Surgical pathology Differential Diagnosis: R/O cyst vs lipoma vs other  Check Margins: No  The surgical wound was then cleaned, prepped, and re-anesthetized as above. Wound edges were undermined extensively along at least one entire edge and at a distance equal to or greater than the width of the defect (see wound defect size above) in order to achieve closure and decrease wound tension and anatomic distortion. Redundant tissue repair including standing cone removal was performed. Hemostasis was achieved with electrocautery. Subcutaneous and epidermal tissues were approximated with the above sutures. The surgical site was then lightly scrubbed with sterile,  saline-soaked gauze. Steri-strips were applied, and the area was then bandaged using  Vaseline ointment, non-adherent gauze, gauze pads, and tape to provide an adequate pressure dressing. The patient tolerated the procedure well, was given detailed written and verbal wound care instructions, and was discharged in good condition.   The patient will follow-up: PRN.  No follow-ups on file.   Documentation: I have reviewed the above documentation for accuracy and completeness, and I agree with the above.  Deneise Finlay, MD

## 2023-08-03 ENCOUNTER — Ambulatory Visit: Payer: Self-pay | Admitting: Dermatology

## 2023-08-03 LAB — SURGICAL PATHOLOGY

## 2023-08-11 DIAGNOSIS — I10 Essential (primary) hypertension: Secondary | ICD-10-CM | POA: Diagnosis not present

## 2023-08-11 DIAGNOSIS — E782 Mixed hyperlipidemia: Secondary | ICD-10-CM | POA: Diagnosis not present

## 2023-08-11 DIAGNOSIS — M1A0720 Idiopathic chronic gout, left ankle and foot, without tophus (tophi): Secondary | ICD-10-CM | POA: Diagnosis not present

## 2023-08-11 DIAGNOSIS — R7303 Prediabetes: Secondary | ICD-10-CM | POA: Diagnosis not present

## 2023-08-11 DIAGNOSIS — N1831 Chronic kidney disease, stage 3a: Secondary | ICD-10-CM | POA: Diagnosis not present

## 2023-10-14 DIAGNOSIS — D123 Benign neoplasm of transverse colon: Secondary | ICD-10-CM | POA: Diagnosis not present

## 2023-10-14 DIAGNOSIS — K621 Rectal polyp: Secondary | ICD-10-CM | POA: Diagnosis not present

## 2023-10-14 DIAGNOSIS — Z860100 Personal history of colon polyps, unspecified: Secondary | ICD-10-CM | POA: Diagnosis not present

## 2023-10-14 DIAGNOSIS — Z09 Encounter for follow-up examination after completed treatment for conditions other than malignant neoplasm: Secondary | ICD-10-CM | POA: Diagnosis not present

## 2023-10-14 DIAGNOSIS — D124 Benign neoplasm of descending colon: Secondary | ICD-10-CM | POA: Diagnosis not present

## 2023-10-14 DIAGNOSIS — K5730 Diverticulosis of large intestine without perforation or abscess without bleeding: Secondary | ICD-10-CM | POA: Diagnosis not present

## 2023-12-28 DIAGNOSIS — M1A0720 Idiopathic chronic gout, left ankle and foot, without tophus (tophi): Secondary | ICD-10-CM | POA: Diagnosis not present

## 2023-12-28 DIAGNOSIS — Z23 Encounter for immunization: Secondary | ICD-10-CM | POA: Diagnosis not present

## 2023-12-28 DIAGNOSIS — N1831 Chronic kidney disease, stage 3a: Secondary | ICD-10-CM | POA: Diagnosis not present

## 2023-12-28 DIAGNOSIS — I10 Essential (primary) hypertension: Secondary | ICD-10-CM | POA: Diagnosis not present

## 2023-12-28 DIAGNOSIS — Z0000 Encounter for general adult medical examination without abnormal findings: Secondary | ICD-10-CM | POA: Diagnosis not present

## 2023-12-28 DIAGNOSIS — E782 Mixed hyperlipidemia: Secondary | ICD-10-CM | POA: Diagnosis not present

## 2023-12-28 DIAGNOSIS — R7303 Prediabetes: Secondary | ICD-10-CM | POA: Diagnosis not present

## 2023-12-28 DIAGNOSIS — E291 Testicular hypofunction: Secondary | ICD-10-CM | POA: Diagnosis not present

## 2024-02-24 ENCOUNTER — Encounter: Payer: Self-pay | Admitting: *Deleted

## 2024-02-24 NOTE — Progress Notes (Signed)
 Barry Clark                                          MRN: [id number]   02/24/2024   The [facility] Quality Team Specialist reviewed this patient medical record for the purposes of chart review for care gap closure. The following were reviewed: chart review for care gap closure-controlling blood pressure.    [facility] Quality Team
